# Patient Record
Sex: Female | Born: 1995 | Race: White | Hispanic: No | Marital: Single | Smoking: Former smoker
Health system: Southern US, Community
[De-identification: ages and names within clinical notes are randomized; demographics above are authoritative.]

## PROBLEM LIST (undated history)

## (undated) ENCOUNTER — Inpatient Hospital Stay: Payer: Self-pay

## (undated) DIAGNOSIS — N289 Disorder of kidney and ureter, unspecified: Secondary | ICD-10-CM

## (undated) DIAGNOSIS — G2581 Restless legs syndrome: Secondary | ICD-10-CM

## (undated) DIAGNOSIS — N23 Unspecified renal colic: Secondary | ICD-10-CM

## (undated) DIAGNOSIS — E039 Hypothyroidism, unspecified: Secondary | ICD-10-CM

## (undated) DIAGNOSIS — K011 Impacted teeth: Secondary | ICD-10-CM

## (undated) DIAGNOSIS — F339 Major depressive disorder, recurrent, unspecified: Secondary | ICD-10-CM

## (undated) DIAGNOSIS — T8859XA Other complications of anesthesia, initial encounter: Secondary | ICD-10-CM

## (undated) DIAGNOSIS — R05 Cough: Secondary | ICD-10-CM

## (undated) DIAGNOSIS — T4145XA Adverse effect of unspecified anesthetic, initial encounter: Secondary | ICD-10-CM

## (undated) DIAGNOSIS — J45909 Unspecified asthma, uncomplicated: Secondary | ICD-10-CM

## (undated) DIAGNOSIS — F1910 Other psychoactive substance abuse, uncomplicated: Secondary | ICD-10-CM

## (undated) DIAGNOSIS — F4310 Post-traumatic stress disorder, unspecified: Secondary | ICD-10-CM

## (undated) DIAGNOSIS — R197 Diarrhea, unspecified: Secondary | ICD-10-CM

## (undated) DIAGNOSIS — N200 Calculus of kidney: Secondary | ICD-10-CM

## (undated) HISTORY — DX: Post-traumatic stress disorder, unspecified: F43.10

## (undated) HISTORY — PX: LITHOTRIPSY: SUR834

## (undated) HISTORY — PX: OTHER SURGICAL HISTORY: SHX169

## (undated) HISTORY — DX: Calculus of kidney: N20.0

## (undated) HISTORY — DX: Other psychoactive substance abuse, uncomplicated: F19.10

## (undated) HISTORY — DX: Diarrhea, unspecified: R19.7

## (undated) HISTORY — DX: Cough: R05

## (undated) HISTORY — DX: Unspecified asthma, uncomplicated: J45.909

## (undated) HISTORY — DX: Major depressive disorder, recurrent, unspecified: F33.9

## (undated) HISTORY — PX: DENTAL SURGERY: SHX609

## (undated) HISTORY — DX: Unspecified renal colic: N23

---
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
## 2015-01-09 ENCOUNTER — Emergency Department

## 2015-01-09 ENCOUNTER — Encounter: Payer: Self-pay | Admitting: Emergency Medicine

## 2015-01-09 ENCOUNTER — Emergency Department
Admission: EM | Admit: 2015-01-09 | Discharge: 2015-01-09 | Disposition: A | Discharge status: Home / Self Care | Attending: Emergency Medicine | Admitting: Emergency Medicine

## 2015-01-09 DIAGNOSIS — F17210 Nicotine dependence, cigarettes, uncomplicated: Secondary | ICD-10-CM | POA: Diagnosis not present

## 2015-01-09 DIAGNOSIS — Z3A08 8 weeks gestation of pregnancy: Secondary | ICD-10-CM | POA: Insufficient documentation

## 2015-01-09 DIAGNOSIS — R109 Unspecified abdominal pain: Secondary | ICD-10-CM | POA: Diagnosis not present

## 2015-01-09 DIAGNOSIS — Z79899 Other long term (current) drug therapy: Secondary | ICD-10-CM | POA: Diagnosis not present

## 2015-01-09 DIAGNOSIS — O210 Mild hyperemesis gravidarum: Secondary | ICD-10-CM | POA: Insufficient documentation

## 2015-01-09 DIAGNOSIS — O99331 Smoking (tobacco) complicating pregnancy, first trimester: Secondary | ICD-10-CM | POA: Insufficient documentation

## 2015-01-09 DIAGNOSIS — O9989 Other specified diseases and conditions complicating pregnancy, childbirth and the puerperium: Secondary | ICD-10-CM | POA: Diagnosis present

## 2015-01-09 LAB — CBC
HEMATOCRIT: 42.6 % (ref 35.0–47.0)
Hemoglobin: 14.6 g/dL (ref 12.0–16.0)
MCH: 29.7 pg (ref 26.0–34.0)
MCHC: 34.2 g/dL (ref 32.0–36.0)
MCV: 86.9 fL (ref 80.0–100.0)
Platelets: 183 10*3/uL (ref 150–440)
RBC: 4.9 MIL/uL (ref 3.80–5.20)
RDW: 12.5 % (ref 11.5–14.5)
WBC: 14.6 10*3/uL — ABNORMAL HIGH (ref 3.6–11.0)

## 2015-01-09 LAB — COMPREHENSIVE METABOLIC PANEL
ALBUMIN: 4.1 g/dL (ref 3.5–5.0)
ALT: 11 U/L — ABNORMAL LOW (ref 14–54)
ANION GAP: 6 (ref 5–15)
AST: 15 U/L (ref 15–41)
Alkaline Phosphatase: 55 U/L (ref 38–126)
BILIRUBIN TOTAL: 0.5 mg/dL (ref 0.3–1.2)
BUN: 8 mg/dL (ref 6–20)
CALCIUM: 9.1 mg/dL (ref 8.9–10.3)
CO2: 24 mmol/L (ref 22–32)
Chloride: 108 mmol/L (ref 101–111)
Creatinine, Ser: 0.52 mg/dL (ref 0.44–1.00)
GFR calc non Af Amer: >60 mL/min (ref >60)
Glucose, Bld: 90 mg/dL (ref 65–99)
POTASSIUM: 3.7 mmol/L (ref 3.5–5.1)
Sodium: 138 mmol/L (ref 135–145)
TOTAL PROTEIN: 6.9 g/dL (ref 6.5–8.1)

## 2015-01-09 LAB — ABO/RH: ABO/RH(D): O POS

## 2015-01-09 LAB — URINALYSIS COMPLETE WITH MICROSCOPIC (ARMC ONLY)
BILIRUBIN URINE: NEGATIVE
Bacteria, UA: NONE SEEN
GLUCOSE, UA: NEGATIVE mg/dL
HGB URINE DIPSTICK: NEGATIVE
Ketones, ur: NEGATIVE mg/dL
LEUKOCYTES UA: NEGATIVE
NITRITE: NEGATIVE
Protein, ur: NEGATIVE mg/dL
SPECIFIC GRAVITY, URINE: 1.017 (ref 1.005–1.030)
pH: 8 (ref 5.0–8.0)

## 2015-01-09 LAB — HCG, QUANTITATIVE, PREGNANCY: HCG, BETA CHAIN, QUANT, S: 138082 m[IU]/mL — AB (ref <5)

## 2015-01-09 MED ORDER — PROMETHAZINE HCL 12.5 MG PO TABS: 12.5000 mg | ORAL_TABLET | Freq: Four times a day (QID) | ORAL | Status: DC | PRN

## 2015-01-09 MED ORDER — SODIUM CHLORIDE 0.9 % IV BOLUS (SEPSIS)
1000.0000 mL | Freq: Once | INTRAVENOUS | Status: AC
  Administered 2015-01-09: 1000 mL via INTRAVENOUS

## 2015-01-09 MED ORDER — PROMETHAZINE HCL 25 MG/ML IJ SOLN
12.5000 mg | Freq: Once | INTRAMUSCULAR | Status: AC
  Administered 2015-01-09: 12.5 mg via INTRAVENOUS
  Filled 2015-01-09: qty 1

## 2015-01-09 NOTE — ED Notes (Signed)
NS bolus still infusing.

## 2015-01-09 NOTE — ED Provider Notes (Signed)
[facility] Emergency Department Provider Note  ____________________________________________  Time seen: 6:15 AM  I have reviewed the triage vital signs and the nursing notes.   HISTORY  Chief Complaint Emesis and Abdominal Pain      HPI Kelsey Serrano is a 19 y.o. female presents with history of being approximately [redacted] weeks pregnant. Patient is G2 para 1 presenting with vomiting 3 days with very poor by mouth intake. Patient also admits to an episode of vaginal spotting on Thursday of last week which is since resolved. Patient states that she was prescribed Zofran by her PMD however this is not alleviating her nausea or vomiting.     Past Medical History  Diagnosis Date  . Asthma   . Renal disorder   . 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  No prior ED visits or hospitalization   . Calculus of kidney 10/25/2014  . Chronic cough 01/01/2012    Overview:  A.  "smokers cough"   . D (diarrhea) 10/25/2014  . 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  Depression, PTSA, mood disorder NOS B.  Psychiatrist:  Mathews Argyleachel Dew C.  Therapist:  Elita Quickheryl Lawson   . Depression, major, recurrent (HCC) 10/02/2011  . Abuse, drug or alcohol 11/07/2010    Overview:  A.  Never IVDA.   prescription drugs, THC, methamphetamines.   B.   Denies any since March 2013.  No methamphetamines 04/24/2009 Addendum Sheppard PentonWolf- [facility] check is negative, reviewed with PCP do not think current problem   . Renal colic 10/25/2014  . Complication of anesthesia     BP and HR were low after lithotripsy([facility])  . Restless leg syndrome   . Impacted tooth     current - wisdon tooth  . Hypothyroidism     when younger - resolved    Patient Active Problem List   Diagnosis Date Noted  . Chronic diarrhea of unknown origin   . Nausea with vomiting   . Gastritis   . D (diarrhea) 10/25/2014  . Abdominal pain, epigastric 10/25/2014  . Calculus of kidney 10/25/2014  . Renal colic 10/25/2014  .  Current smoker 04/12/2013  . Chronic cough 01/01/2012  . 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 Depression, major, recurrent (HCC) 10/02/2011  . 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 Abuse, drug or alcohol 11/07/2010    Past Surgical History  Procedure Laterality Date  . Lithotripsy    . Kidney stones Left   . Kidney stent    . Colonoscopy with propofol N/A 12/04/2014    Procedure: COLONOSCOPY WITH PROPOFOL;  Surgeon: Midge Miniumarren Wohl, MD;  [facility];  Service: Endoscopy;  Laterality: N/A;  . Esophagogastroduodenoscopy (egd) with propofol N/A 12/04/2014    Procedure: ESOPHAGOGASTRODUODENOSCOPY (EGD) WITH PROPOFOL;  Surgeon: Midge Miniumarren Wohl, MD;  [facility];  Service: Endoscopy;  Laterality: N/A;    Current Outpatient Rx  Name  Route  Sig  Dispense  Refill  . acetaminophen (TYLENOL) 325 MG tablet   Oral   Take 650 mg by mouth every 6 (six) hours as needed for mild pain, moderate pain, fever or headache.         . dicyclomine (BENTYL) 20 MG tablet   Oral   Take 1 tablet (20 mg total) by mouth 3 (three) times daily as needed for spasms.   30 tablet   0   . esomeprazole (NEXIUM) 20 MG capsule   Oral   Take 1 capsule (20 mg total) by  mouth daily.   30 capsule   1   . fluconazole (DIFLUCAN) 150 MG tablet   Oral   Take 1 tablet (150 mg total) by mouth daily. Patient not taking: Reported on 11/30/2014   1 tablet   0   . HYDROcodone-acetaminophen (NORCO/VICODIN) 5-325 MG per tablet   Oral   Take 1 tablet by mouth every 6 (six) hours as needed for moderate pain. Patient not taking: Reported on 12/06/2014   15 tablet   0   . metoCLOPramide (REGLAN) 10 MG tablet   Oral   Take 1 tablet (10 mg total) by mouth every 6 (six) hours as needed for nausea or vomiting.   12 tablet   1   . Prenatal Vit-Fe Fumarate-FA (PNV PRENATAL PLUS MULTIVITAMIN) 27-1 MG TABS   Oral   Take 1 tablet by mouth daily.      1   . tamsulosin (FLOMAX) 0.4 MG CAPS  capsule   Oral   Take 0.4 mg by mouth daily.           Allergies Estrogens; Lavender oil; and Zyrtec  Family History  Problem Relation Age of Onset  . Diabetes Mother     Social History Social History  Substance Use Topics  . Smoking status: Current Every Day Smoker -- 0.25 packs/day for 7 years    Types: Cigarettes  . Smokeless tobacco: Never Used  . Alcohol Use: Yes     Comment: rare- couple times per year    Review of Systems  Constitutional: Negative for fever. Eyes: Negative for visual changes. ENT: Negative for sore throat. Cardiovascular: Negative for chest pain. Respiratory: Negative for shortness of breath. Gastrointestinal: Negative for abdominal pain. Positive for vomiting Genitourinary: Negative for dysuria. Musculoskeletal: Negative for back pain. Skin: Negative for rash. Neurological: Negative for headaches, focal weakness or numbness.   10-point ROS otherwise negative.  ____________________________________________   PHYSICAL EXAM:  VITAL SIGNS: ED Triage Vitals  Enc Vitals Group     BP 01/09/15 0346 117/52 mmHg     Pulse Rate 01/09/15 0346 72     Resp --      Temp 01/09/15 0346 97.8 F (36.6 C)     Temp Source 01/09/15 0346 Oral     SpO2 01/09/15 0346 100 %     Weight 01/09/15 0346 125 lb (56.7 kg)     Height 01/09/15 0346 5\' 2"  (1.575 m)     Head Cir --      Peak Flow --      Pain Score 01/09/15 0401 7     Pain Loc --      Pain Edu? --      Excl. in GC? --      Constitutional: Alert and oriented. Well appearing and in no distress. Eyes: Conjunctivae are normal. PERRL. Normal extraocular movements. ENT   Head: Normocephalic and atraumatic.   Nose: No congestion/rhinnorhea.   Mouth/Throat: Dry oral and nasal mucosa   Neck: No stridor. Hematological/Lymphatic/Immunilogical: No cervical lymphadenopathy. Cardiovascular: Normal rate, regular rhythm. Normal and symmetric distal pulses are present in all extremities. No  murmurs, rubs, or gallops. Respiratory: Normal respiratory effort without tachypnea nor retractions. Breath sounds are clear and equal bilaterally. No wheezes/rales/rhonchi. Gastrointestinal: Soft and nontender. No distention. There is no CVA tenderness. Genitourinary: deferred Musculoskeletal: Nontender with normal range of motion in all extremities. No joint effusions.  No lower extremity tenderness nor edema. Neurologic:  Normal speech and language. No gross focal neurologic deficits are appreciated. Speech  is normal.  Skin:  Skin is warm, dry and intact. No rash noted. Psychiatric: Mood and affect are normal. Speech and behavior are normal. Patient exhibits appropriate insight and judgment.  ____________________________________________    LABS (pertinent positives/negatives)  Labs Reviewed  CBC - Abnormal; Notable for the following:    WBC 14.6 (*)    All other components within normal limits  COMPREHENSIVE METABOLIC PANEL - Abnormal; Notable for the following:    ALT 11 (*)    All other components within normal limits  HCG, QUANTITATIVE, PREGNANCY - Abnormal; Notable for the following:    hCG, Beta Francene Finders [id number] (*)    All other components within normal limits  URINALYSIS COMPLETEWITH MICROSCOPIC ([facility] ONLY) - Abnormal; Notable for the following:    Color, Urine YELLOW (*)    APPearance CLEAR (*)    Squamous Epithelial / LPF 0-5 (*)    All other components within normal limits  ABO/RH        RADIOLOGY  OB Comp Less 14 Wks (Final result) Result time: 01/09/15 05:42:51   Final result by Rad Results In Interface (01/09/15 05:42:51)   Narrative:   CLINICAL DATA: Abdominal pain in first trimester pregnancy  EXAM: OBSTETRIC <14 WK [place] AND TRANSVAGINAL OB US  TECHNIQUE: Both transabdominal and transvaginal ultrasound examinations were performed for complete evaluation of the gestation as well as the maternal uterus, adnexal regions, and pelvic  cul-de-sac. Transvaginal technique was performed to assess early pregnancy.  COMPARISON: Abdominal CT 12/06/2014  FINDINGS: Intrauterine gestational sac: Visualized/normal in shape. No subchorionic hematoma  Yolk sac: Present  Embryo: Present  Cardiac Activity: Present  Heart Rate: 160 bpm  CRL: 14.6 mm  7 w  6 d         [place] EDC: 08/22/2015  Maternal uterus/adnexae: Physiologic appearance of the ovaries. No free pelvic fluid.  IMPRESSION: Single intrauterine gestation measuring 7 weeks 6 days. No adverse findings.   Electronically Signed By: Marnee Spring M.D. On: 01/09/2015 05:42          US Ob Transvaginal (Final result) Result time: 01/09/15 05:42:51   Final result by Rad Results In Interface (01/09/15 05:42:51)   Narrative:   CLINICAL DATA: Abdominal pain in first trimester pregnancy  EXAM: OBSTETRIC <14 WK [place] AND TRANSVAGINAL OB US  TECHNIQUE: Both transabdominal and transvaginal ultrasound examinations were performed for complete evaluation of the gestation as well as the maternal uterus, adnexal regions, and pelvic cul-de-sac. Transvaginal technique was performed to assess early pregnancy.  COMPARISON: Abdominal CT 12/06/2014  FINDINGS: Intrauterine gestational sac: Visualized/normal in shape. No subchorionic hematoma  Yolk sac: Present  Embryo: Present  Cardiac Activity: Present  Heart Rate: 160 bpm  CRL: 14.6 mm  7 w  6 d         [place] EDC: 08/22/2015  Maternal uterus/adnexae: Physiologic appearance of the ovaries. No free pelvic fluid.  IMPRESSION: Single intrauterine gestation measuring 7 weeks 6 days. No adverse findings.   Electronically Signed By: Marnee Spring M.D. On: 01/09/2015 05:42      INITIAL IMPRESSION / ASSESSMENT AND PLAN / ED COURSE  Pertinent labs & imaging results that were available during my care of the patient were reviewed by me and considered in my medical  decision making (see chart for details).  History of physical exam consistent with hyperemesis gravidarum. Patient received 12.5 mg of IV Phenergan as well as normal saline 2 L IV.  ____________________________________________   FINAL CLINICAL IMPRESSION(S) / ED DIAGNOSES  Final  diagnoses:  None      Darci Current, MD 01/09/15 [phone]

## 2015-01-09 NOTE — Discharge Instructions (Signed)
Hyperemesis Gravidarum  Hyperemesis gravidarum is a severe form of nausea and vomiting that happens during pregnancy. Hyperemesis is worse than morning sickness. It may cause you to have nausea or vomiting all day for many days. It may keep you from eating and drinking enough food and liquids. Hyperemesis usually occurs during the first half (the first 20 weeks) of pregnancy. It often goes away once a woman is in her second half of pregnancy. However, sometimes hyperemesis continues through an entire pregnancy.   CAUSES   The cause of this condition is not completely known but is thought to be related to changes in the body's hormones when pregnant. It could be from the high level of the pregnancy hormone or an increase in estrogen in the body.   SIGNS AND SYMPTOMS    Severe nausea and vomiting.   Nausea that does not go away.   Vomiting that does not allow you to keep any food down.   Weight loss and body fluid loss (dehydration).   Having no desire to eat or not liking food you have previously enjoyed.  DIAGNOSIS   Your health care provider will do a physical exam and ask you about your symptoms. He or she may also order blood tests and urine tests to make sure something else is not causing the problem.   TREATMENT   You may only need medicine to control the problem. If medicines do not control the nausea and vomiting, you will be treated in the hospital to prevent dehydration, increased acid in the blood (acidosis), weight loss, and changes in the electrolytes in your body that may harm the unborn baby (fetus). You may need IV fluids.   HOME CARE INSTRUCTIONS    Only take over-the-counter or prescription medicines as directed by your health care provider.   Try eating a couple of dry crackers or toast in the morning before getting out of bed.   Avoid foods and smells that upset your stomach.   Avoid fatty and spicy foods.   Eat 5-6 small meals a day.   Do not drink when eating meals. Drink between  meals.   For snacks, eat high-protein foods, such as cheese.   Eat or suck on things that have ginger in them. Ginger helps nausea.   Avoid food preparation. The smell of food can spoil your appetite.   Avoid iron pills and iron in your multivitamins until after 3-4 months of being pregnant. However, consult with your health care provider before stopping any prescribed iron pills.  SEEK MEDICAL CARE IF:    Your abdominal pain increases.   You have a severe headache.   You have vision problems.   You are losing weight.  SEEK IMMEDIATE MEDICAL CARE IF:    You are unable to keep fluids down.   You vomit blood.   You have constant nausea and vomiting.   You have excessive weakness.   You have extreme thirst.   You have dizziness or fainting.   You have a fever or persistent symptoms for more than 2-3 days.   You have a fever and your symptoms suddenly get worse.  MAKE SURE YOU:    Understand these instructions.   Will watch your condition.   Will get help right away if you are not doing well or get worse.     This information is not intended to replace advice given to you by your health care provider. Make sure you discuss any questions you have with   your health care provider.     Document Released: 02/17/2005 Document Revised: 12/08/2012 Document Reviewed: 09/29/2012  Elsevier Interactive Patient Education 2016 [employer]
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
## 2015-01-09 NOTE — ED Notes (Addendum)
Patient ambulatory to triage with steady gait, without difficulty or distress noted; pt reports vomiting with abd pain since Friday; st taking zofran as rx by PCP by not helping; pt st [redacted]wks pregnant; G2; at end of triage, pt reports this am, fell in shower x 2

## 2015-01-09 NOTE — ED Notes (Signed)
[employer] to bedside.

## 2015-06-30 ENCOUNTER — Encounter: Payer: Self-pay | Admitting: *Deleted

## 2015-06-30 ENCOUNTER — Observation Stay
Admission: EM | Admit: 2015-06-30 | Discharge: 2015-06-30 | Disposition: A | Discharge status: Home / Self Care | Attending: Obstetrics and Gynecology | Admitting: Obstetrics and Gynecology

## 2015-06-30 DIAGNOSIS — O2343 Unspecified infection of urinary tract in pregnancy, third trimester: Principal | ICD-10-CM | POA: Insufficient documentation

## 2015-06-30 DIAGNOSIS — O9989 Other specified diseases and conditions complicating pregnancy, childbirth and the puerperium: Secondary | ICD-10-CM | POA: Insufficient documentation

## 2015-06-30 DIAGNOSIS — B9620 Unspecified Escherichia coli [E. coli] as the cause of diseases classified elsewhere: Secondary | ICD-10-CM | POA: Diagnosis not present

## 2015-06-30 DIAGNOSIS — R112 Nausea with vomiting, unspecified: Secondary | ICD-10-CM | POA: Diagnosis not present

## 2015-06-30 DIAGNOSIS — T887XXA Unspecified adverse effect of drug or medicament, initial encounter: Secondary | ICD-10-CM | POA: Diagnosis present

## 2015-06-30 DIAGNOSIS — Z3A32 32 weeks gestation of pregnancy: Secondary | ICD-10-CM | POA: Insufficient documentation

## 2015-06-30 MED ORDER — PROMETHAZINE HCL 25 MG PO TABS: 25.0000 mg | ORAL_TABLET | Freq: Four times a day (QID) | ORAL | Status: DC | PRN

## 2015-06-30 MED ORDER — CEFTRIAXONE SODIUM 1 G IJ SOLR
INTRAMUSCULAR | Status: AC
  Administered 2015-06-30: 1 g via INTRAMUSCULAR
  Filled 2015-06-30: qty 10

## 2015-06-30 MED ORDER — CEFTRIAXONE SODIUM 1 G IJ SOLR
1.0000 g | INTRAMUSCULAR | Status: DC
  Administered 2015-06-30: 1 g via INTRAMUSCULAR

## 2015-06-30 MED ORDER — LIDOCAINE HCL (PF) 1 % IJ SOLN
INTRAMUSCULAR | Status: AC
  Administered 2015-06-30: 2 mL
  Filled 2015-06-30: qty 2

## 2015-06-30 MED ORDER — AMOXICILLIN 500 MG PO CAPS: 500.0000 mg | ORAL_CAPSULE | Freq: Two times a day (BID) | ORAL | Status: DC

## 2015-06-30 NOTE — OB Triage Note (Signed)
Prescribed Augmentin for bladder/kidney issues (stones) inability to urinate. Augmentin started yesterday. 2 doses taken. N/V started after first dose approx. 0900/1000 yesterday am. Unable to hold down food or liquids. Pt seen @ [facility] for prenatal care. Pt. Lives in [place]  [place], Kelsey Serrano

## 2015-06-30 NOTE — Final Progress Note (Signed)
L&D OB Triage Note  Sima MatasKyrsten E Buchan is a 20 y.o. [id number] female at 2258w6d, EDD Estimated Date of Delivery: 08/19/15 who presented to triage for complaints of nausea and vomiting.  Patient believes it is due to the recent antibiotic she was prescribed for her UTI (currently on Augmentin, [facility]  Patient has a h/o bladder/kidney issues (stones), with impaired ability to urinate at times. She was evaluated by the nurses with no other significant findings.  No further episodes of vomiting while in triage. Vital signs stable. An NST was performed and has been reviewed by MD. She was treated with 1 dose of IV Ceftriaxone.   NST INTERPRETATION: Indications: rule out uterine contractions  Mode: External Baseline Rate (A): 125 bpm Variability: Moderate Accelerations: 15 x 15 Decelerations: None     Contraction Frequency (min): no ctx noted  Impression: reactive   Plan: NST performed was reviewed and was found to be reactive. She was discharged home with new prescription for Amoxicillin (noted sensitivity on recent U Cx with E. Coli bacteria).  Also given prescription for Phenergan, can take 15-30 min prior to antibiotic dose. [facility]. Follow up with OB/GYN as previously scheduled.     Hildred LaserAnika Christopher Hink, MD [facility]

## 2015-07-05 IMAGING — US US RENAL
1 series · 14 of 25 positions shown · non-contrast
Comparison: CT scan dated 07/06/2014

CLINICAL DATA: Left flank pain.

EXAM:
RENAL / URINARY TRACT ULTRASOUND COMPLETE

[Series 1: us renal · 0.20mm/px · 14 of 61 slices shown]
[im 1/61]
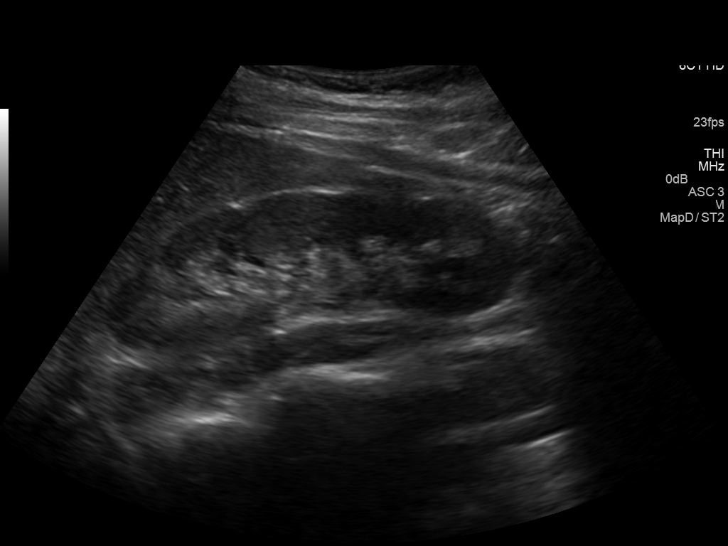
[im 6/61]
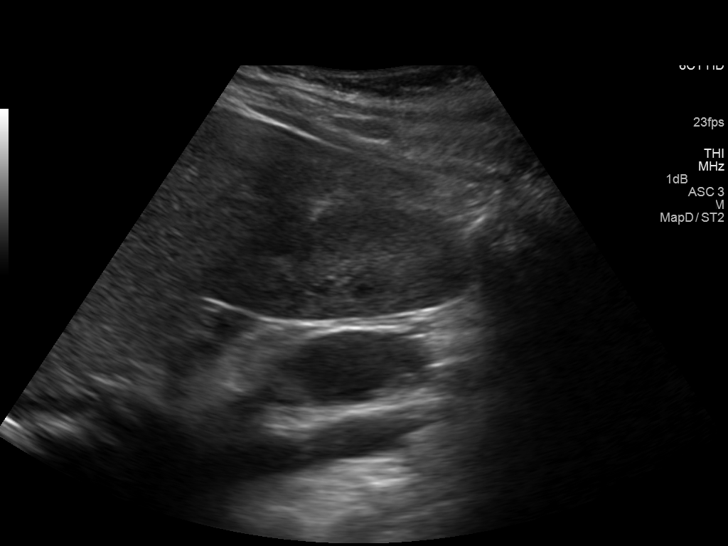
[im 11/61]
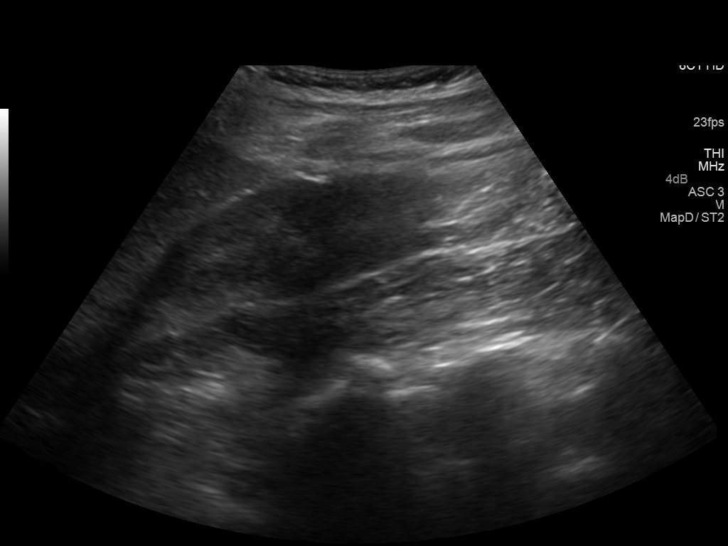
[im 16/61]
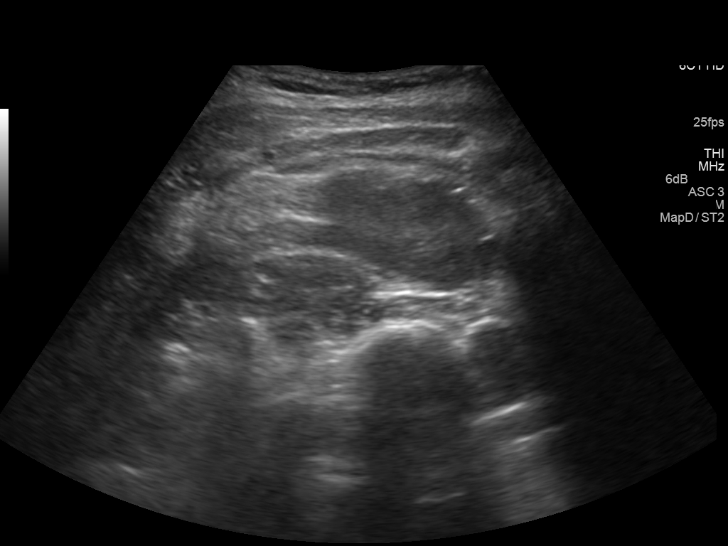
[im 21/61]
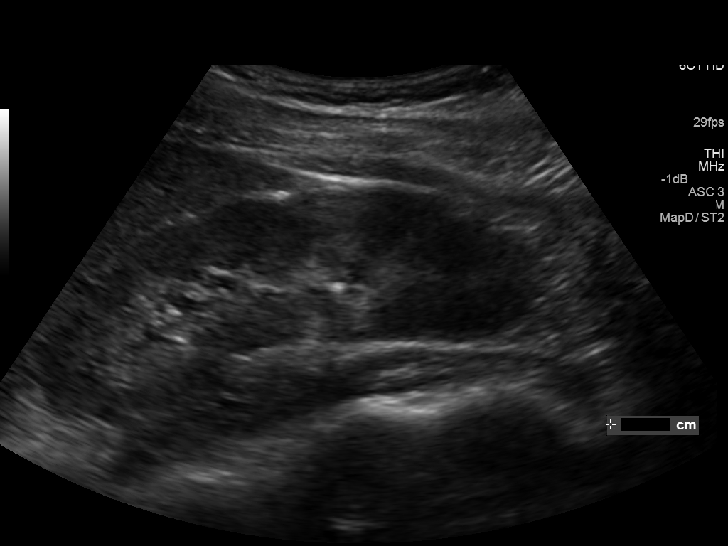
[im 23/61]
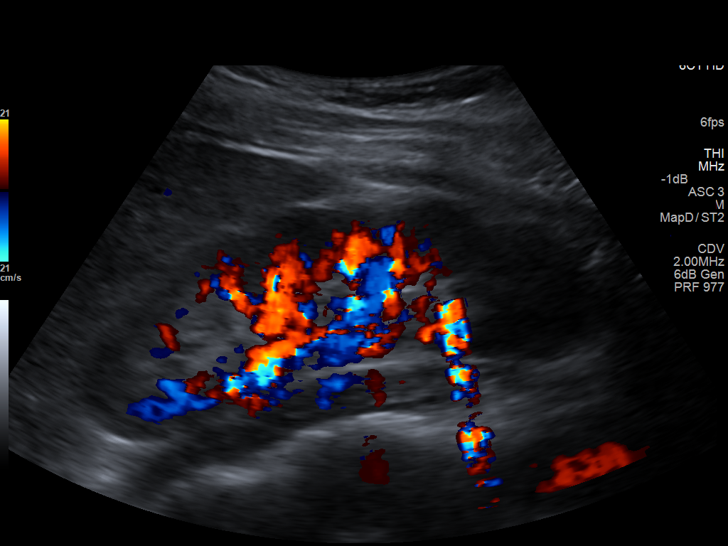
[im 28/61]
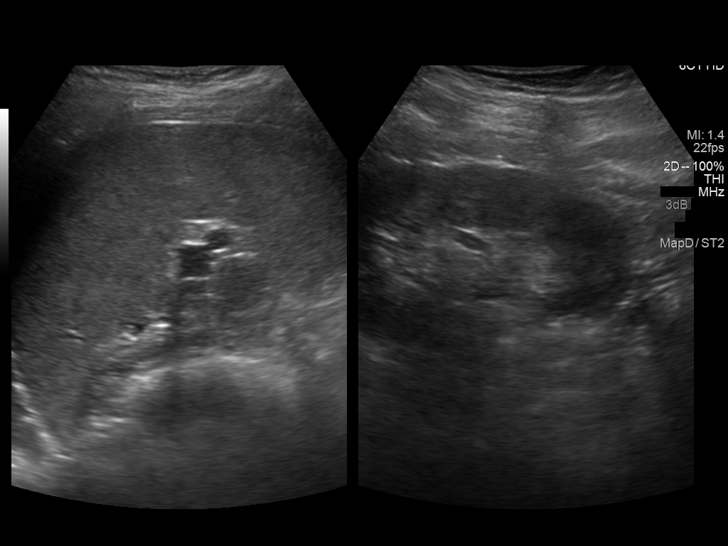
[im 33/61]
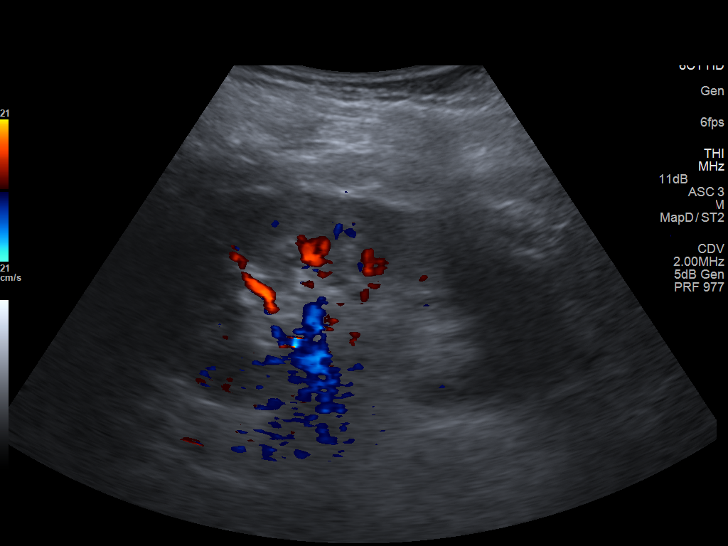
[im 38/61]
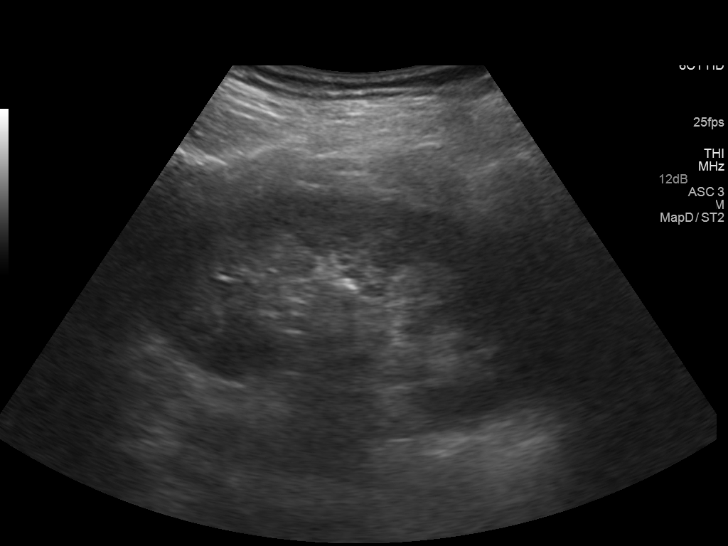
[im 41/61]
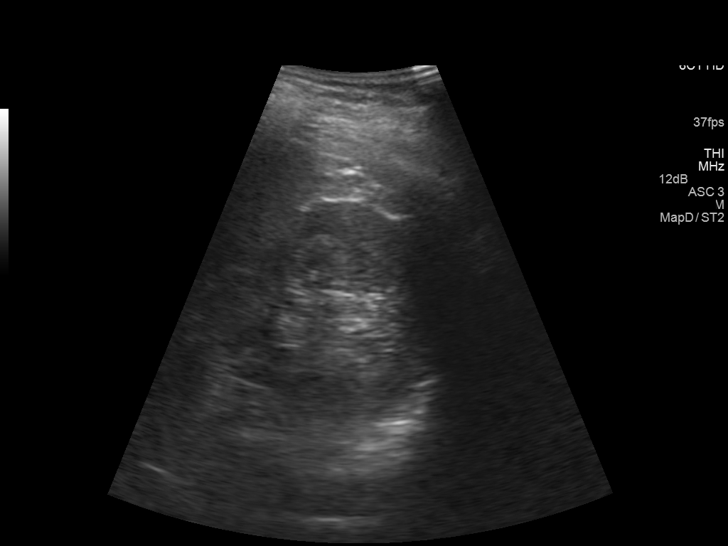
[im 46/61]
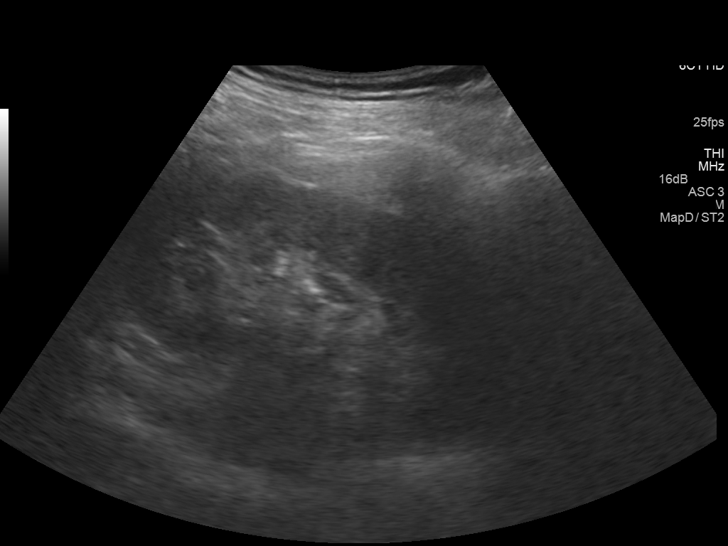
[im 51/61]
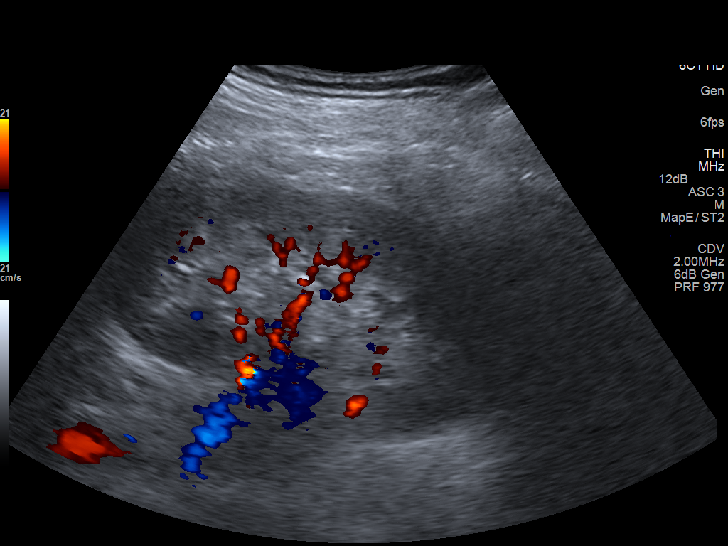
[im 56/61]
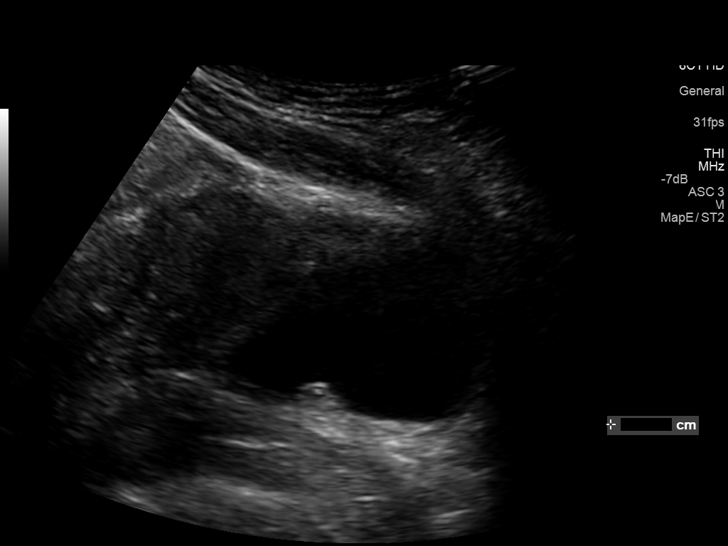
[im 61/61]
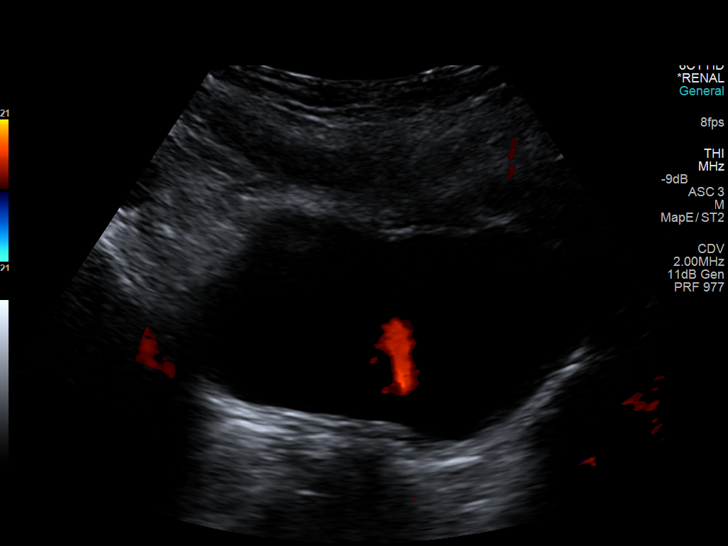

[14 of 25 positions shown; findings below may reference images not displayed]

FINDINGS: Right Kidney:

Length: 11.3 cm. Several tiny stones in the kidney. Echogenicity
within normal limits. No mass or hydronephrosis visualized.

Left Kidney:

Length: 11.2 cm. Tiny stone in the mid left kidney. Echogenicity
within normal limits. No mass or hydronephrosis visualized.

Bladder:

Bilateral ureteral jets identified.
IMPRESSION: Tiny stones in each kidney. No acute abnormality. No hydronephrosis.

## 2015-07-31 IMAGING — US US RENAL
1 series · 14 of 25 positions shown · non-contrast
Comparison: Renal ultrasound and CT 08/05/2014.

CLINICAL DATA: Microscopic hematuria with left flank pain for 2
months. Right-sided lithotripsy 2 months ago per

EXAM:
RENAL / URINARY TRACT ULTRASOUND COMPLETE

[Series 1: us renal · 0.23mm/px · 14 of 34 slices shown]
[im 1/34]
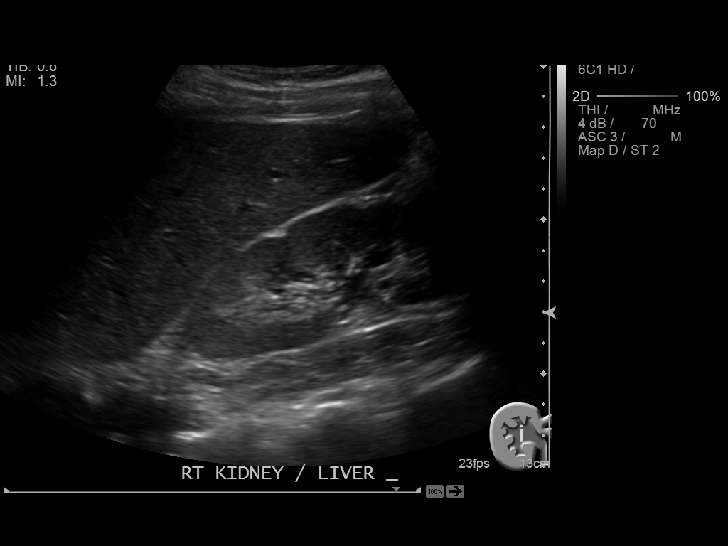
[im 3/34]
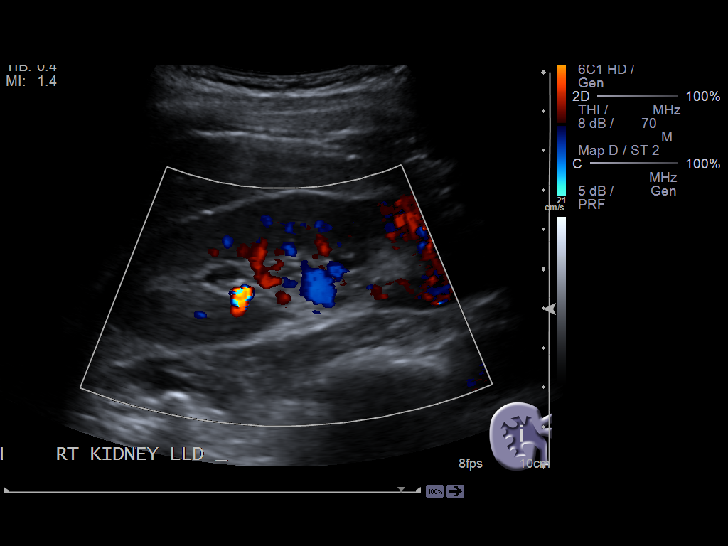
[im 6/34]
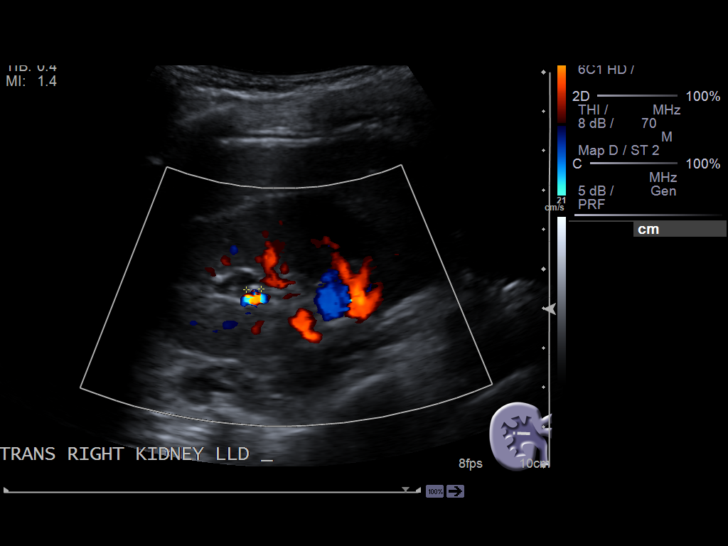
[im 9/34]
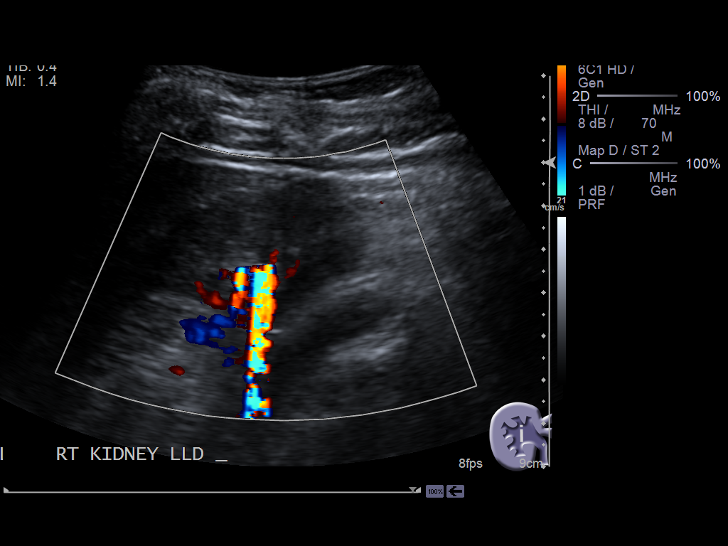
[im 12/34]
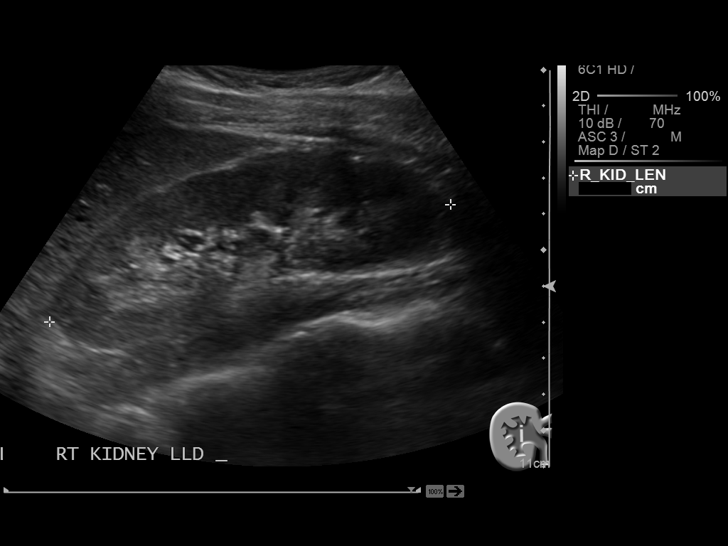
[im 13/34]
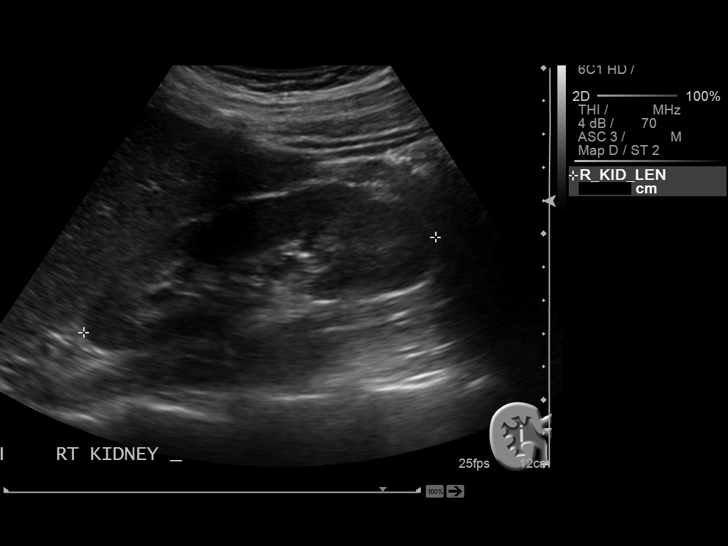
[im 16/34]
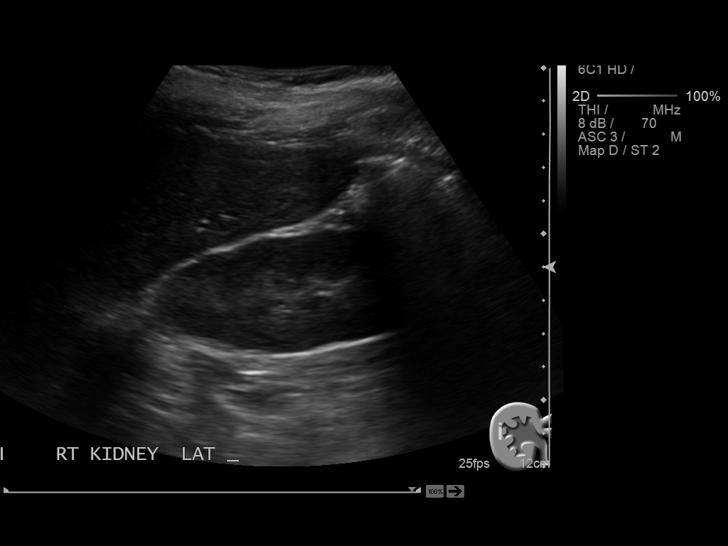
[im 18/34]
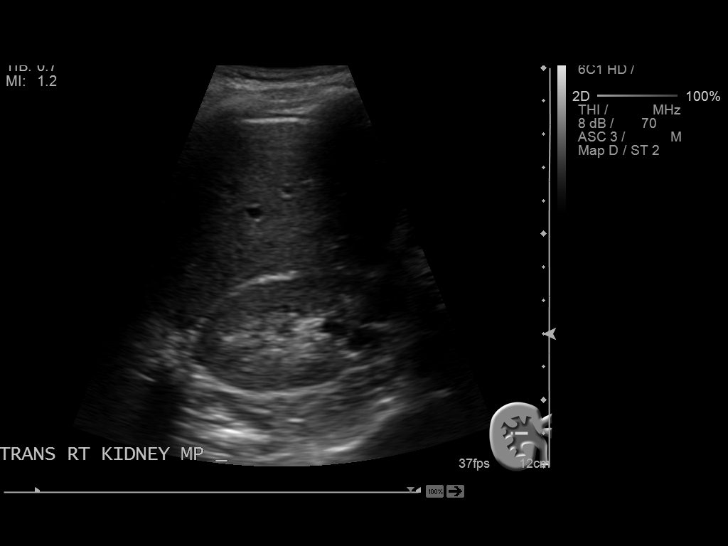
[im 21/34]
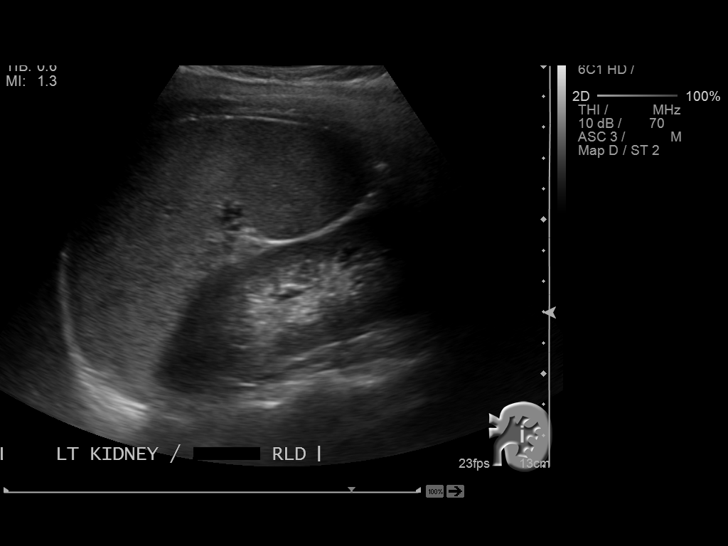
[im 23/34]
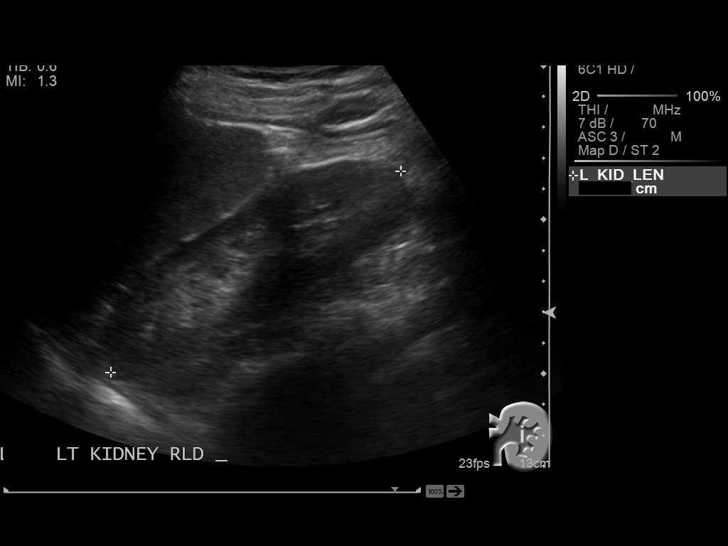
[im 25/34]
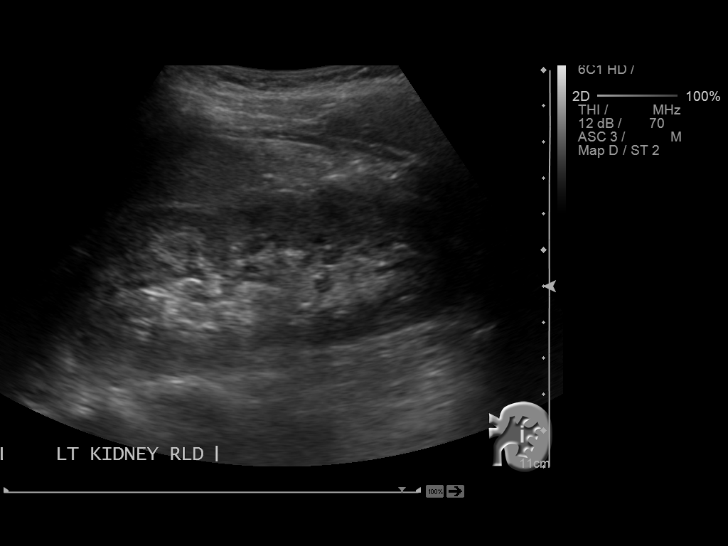
[im 28/34]
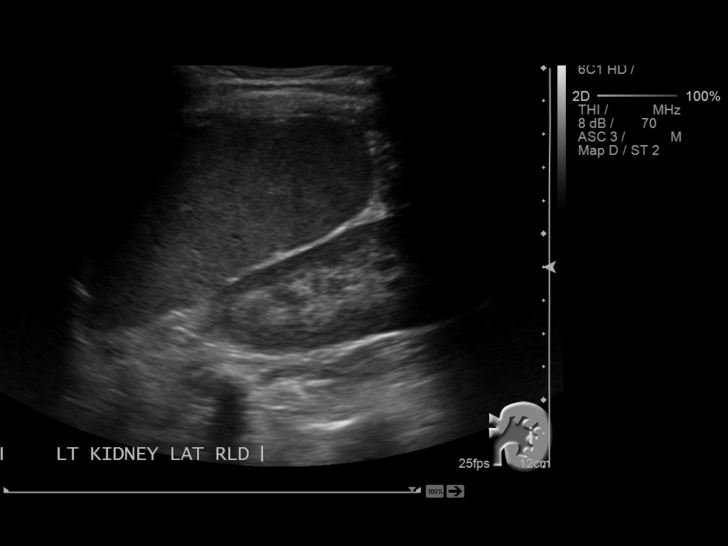
[im 31/34]
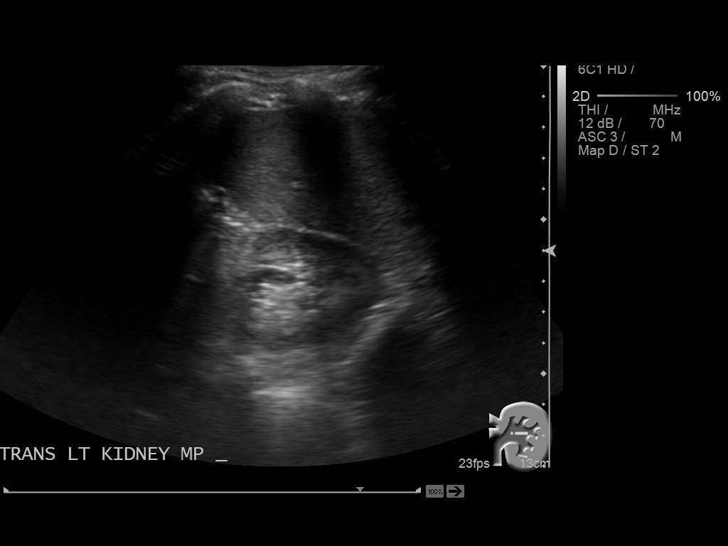
[im 34/34]
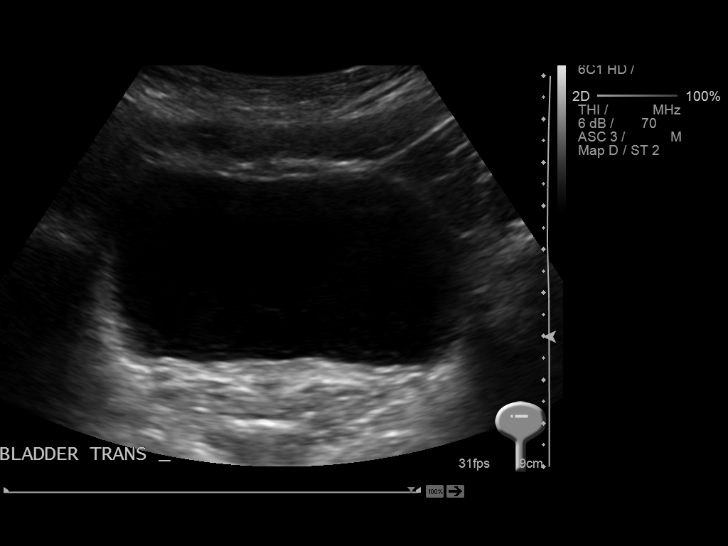

[14 of 25 positions shown; findings below may reference images not displayed]

FINDINGS: Right Kidney:

Length: 11.6 cm. There are probable small caliceal calculi, but no
hydronephrosis or focal cortical lesion.

Left Kidney:

Length: 11.5 cm. Echogenicity within normal limits. No mass or
hydronephrosis visualized. No definite left renal calculus seen by
ultrasound.

Bladder:

Possible dependent debris within the urinary bladder. Assessment for
ureteral jets not performed.
IMPRESSION: 1. No evidence hydronephrosis.
2. Small caliceal calculi are demonstrated on the right. Patient has
known bilateral renal calculi based on prior studies.
3. Possible dependent debris in the bladder.

## 2015-10-29 IMAGING — CT CT ABD-PELV W/ CM
1 of 3 series · 12 of 32 positions shown, 18 images · IV contrast (omnipaque)
Comparison: CT of the abdomen and pelvis from 07/06/2014, and renal
ultrasound performed earlier today at [DATE] p.m.

CLINICAL DATA: Acute onset of left flank pain for 2 days, with
nausea and vomiting. Blood in stool. Recent right-sided lithotripsy
and ureteral stent placement, status post removal. Initial
encounter.

EXAM:
CT ABDOMEN AND PELVIS WITH CONTRAST
TECHNIQUE: Multidetector CT imaging of the abdomen and pelvis was performed
using the standard protocol following bolus administration of
intravenous contrast.
CONTRAST:  100mL OMNIPAQUE IOHEXOL 300 MG/ML  SOLN

[Series 2: routine abd pel with · axial · 0.66mm/px · z∈[-486,-111]mm · 12 of 89 slices shown, 18 images]
[im 7/89  soft-tissue]
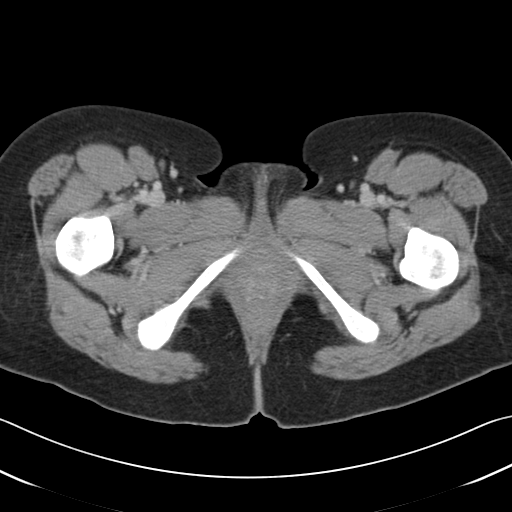
[im 7/89  bone]
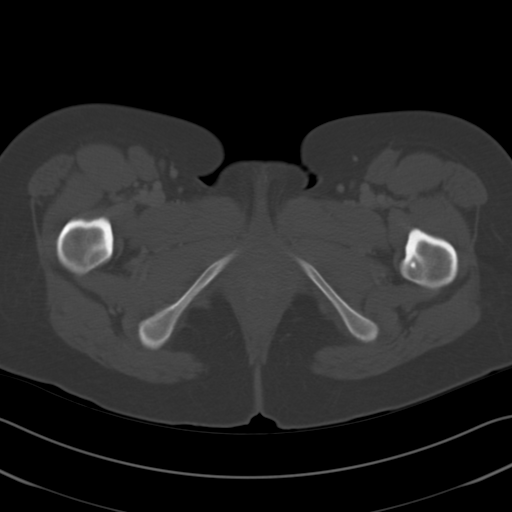
[im 13/89  soft-tissue]
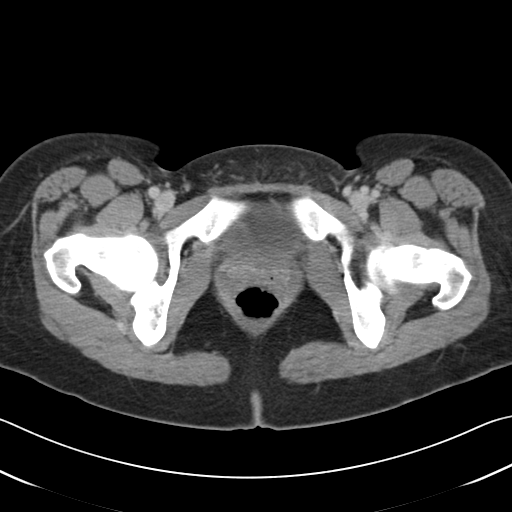
[im 19/89  soft-tissue]
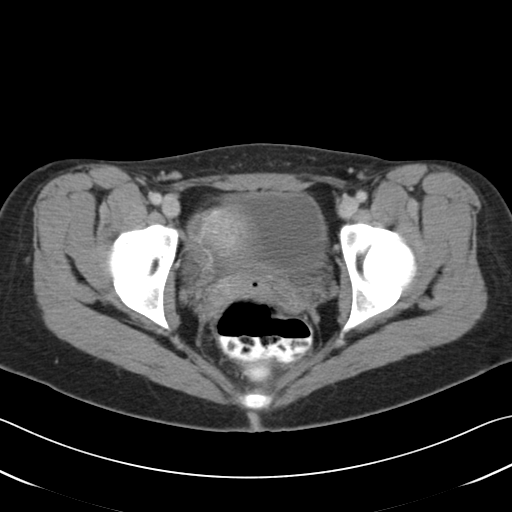
[im 26/89  soft-tissue]
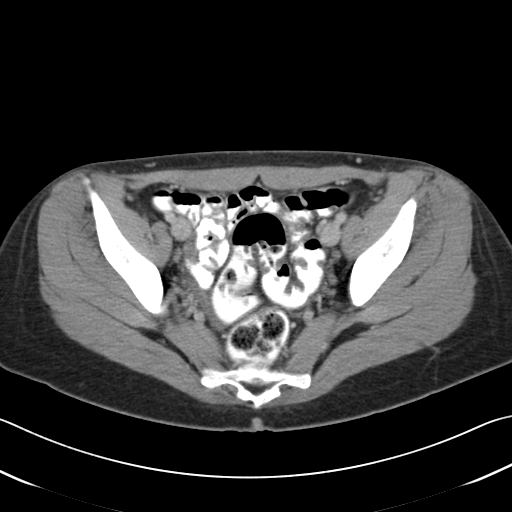
[im 32/89  soft-tissue]
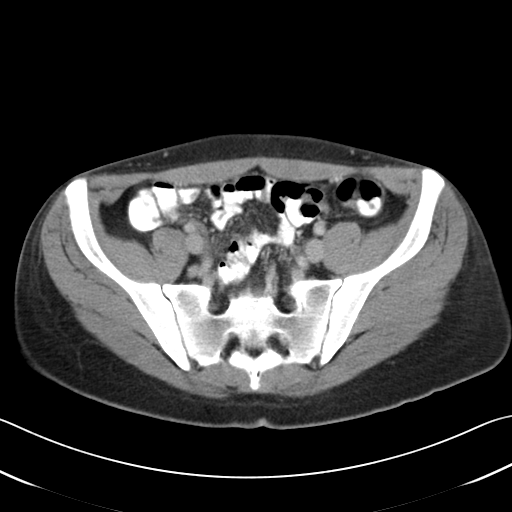
[im 38/89  soft-tissue]
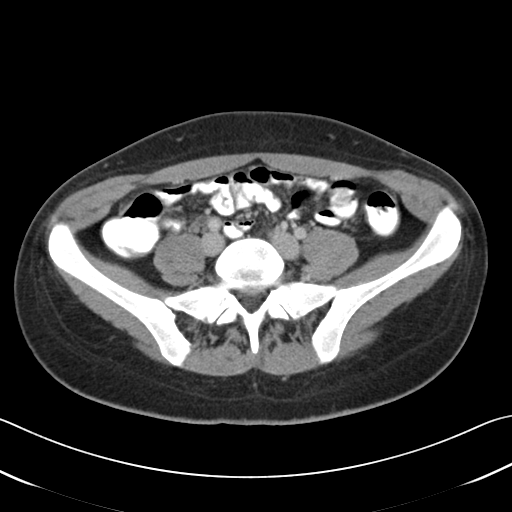
[im 51/89  soft-tissue]
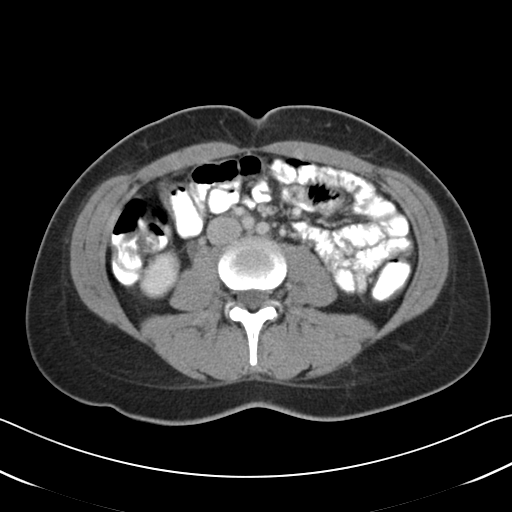
[im 57/89  soft-tissue]
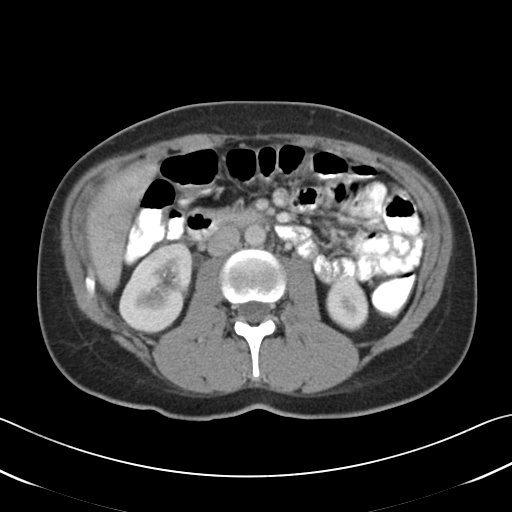
[im 63/89  soft-tissue]
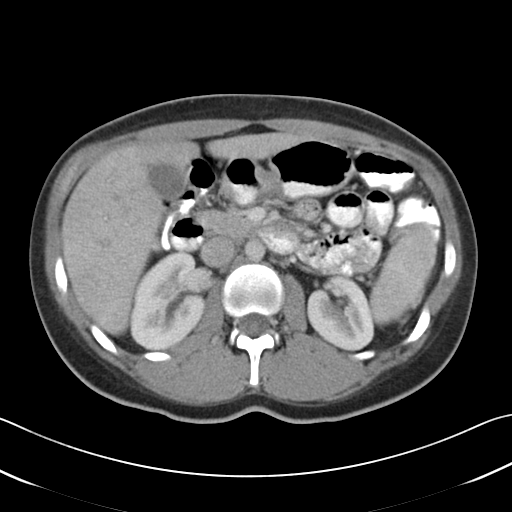
[im 63/89  lung]
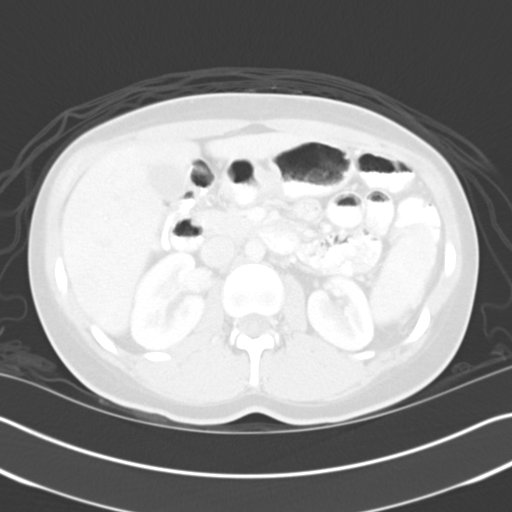
[im 63/89  bone]
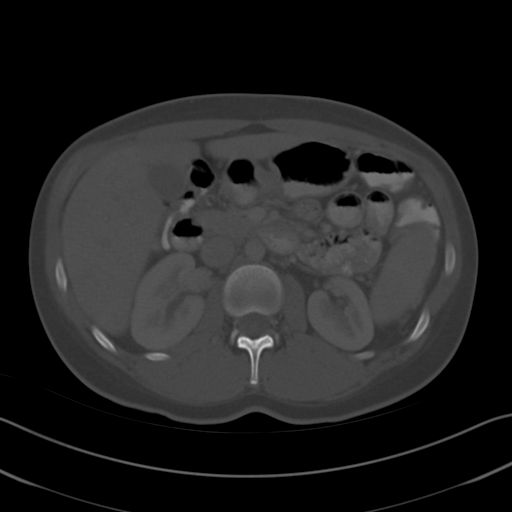
[im 70/89  soft-tissue]
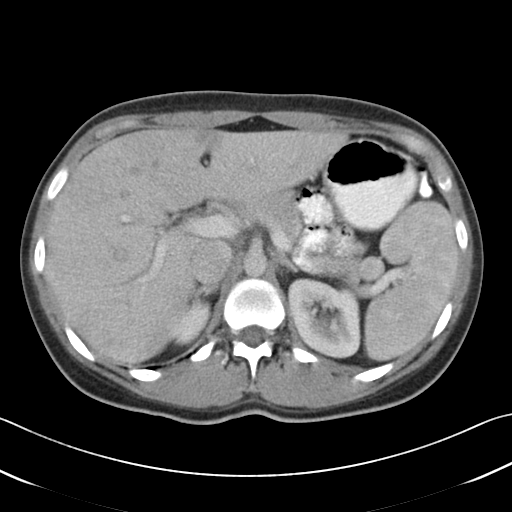
[im 70/89  lung]
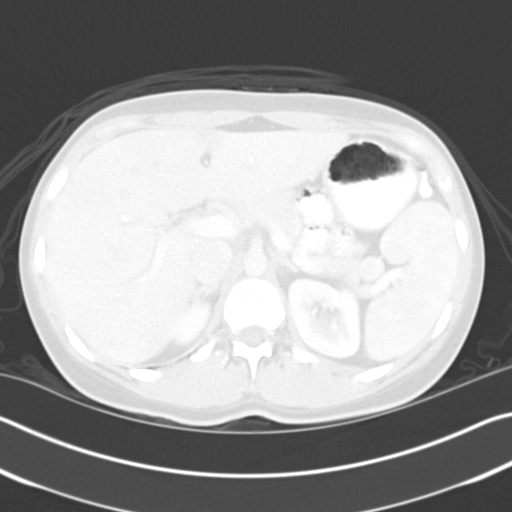
[im 76/89  soft-tissue]
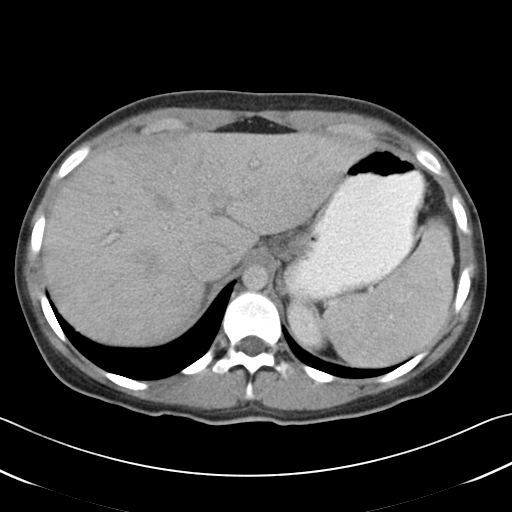
[im 76/89  lung]
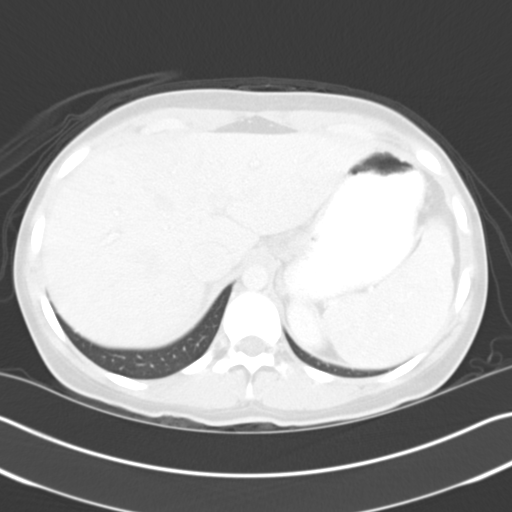
[im 82/89  soft-tissue]
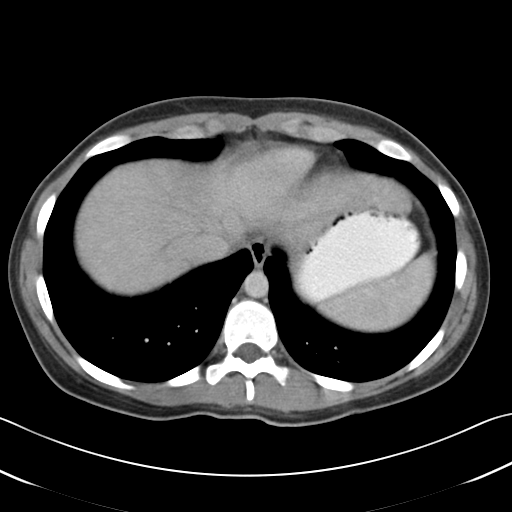
[im 82/89  lung]
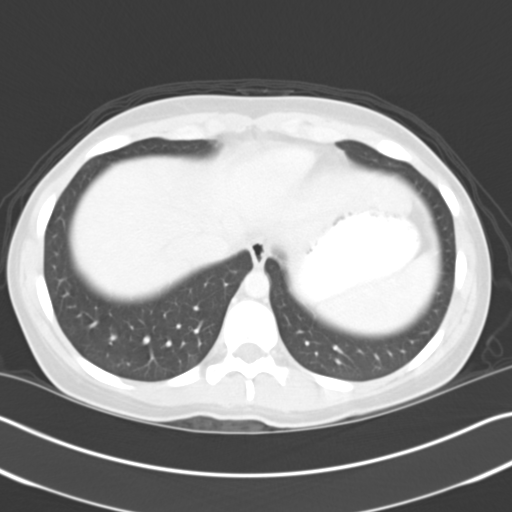

[12 of 32 positions shown; findings below may reference images not displayed]

FINDINGS: The visualized lung bases are clear.

The liver and spleen are unremarkable in appearance. Mild periportal
edema may reflect volume repletion. The gallbladder is within normal
limits. The pancreas and adrenal glands are unremarkable.

There is mild prominence of the right ureter, with mildly increased
wall enhancement. This may simply reflect recent ureteral stent
removal. Alternatively, this could reflect mild ureteritis, though
the patient's symptoms are on the left side. No significant
hydronephrosis is seen.

A few tiny bilateral renal stones are seen, measuring up to 3 mm in
size. No obstructing ureteral stones are identified. No perinephric
stranding is appreciated.

No free fluid is identified. The small bowel is unremarkable in
appearance. The stomach is within normal limits. No acute vascular
abnormalities are seen.

The appendix is normal in caliber, without evidence of appendicitis.
The colon is unremarkable in appearance. Contrast progresses to the
level of the rectum.

The bladder is mildly distended and grossly unremarkable. The uterus
is unremarkable in appearance. The ovaries are relatively symmetric.
No suspicious adnexal masses are seen. No inguinal lymphadenopathy
is seen.

No acute osseous abnormalities are identified.
IMPRESSION: 1. Mild prominence of the right ureter, with mildly increased right
ureteral wall enhancement. This may simply reflect recent ureteral
stent removal. Alternatively, this could reflect mild ureteritis,
though the patient's symptoms are on the left side. No significant
hydronephrosis seen.
2. Few tiny nonobstructing bilateral renal stones measure up to 3 mm
in size.

## 2016-01-18 ENCOUNTER — Emergency Department

## 2016-01-18 ENCOUNTER — Encounter: Payer: Self-pay | Admitting: Emergency Medicine

## 2016-01-18 ENCOUNTER — Emergency Department
Admission: EM | Admit: 2016-01-18 | Discharge: 2016-01-18 | Disposition: A | Discharge status: Home / Self Care | Attending: Emergency Medicine | Admitting: Emergency Medicine

## 2016-01-18 DIAGNOSIS — E039 Hypothyroidism, unspecified: Secondary | ICD-10-CM | POA: Diagnosis not present

## 2016-01-18 DIAGNOSIS — F17210 Nicotine dependence, cigarettes, uncomplicated: Secondary | ICD-10-CM | POA: Insufficient documentation

## 2016-01-18 DIAGNOSIS — J45909 Unspecified asthma, uncomplicated: Secondary | ICD-10-CM | POA: Insufficient documentation

## 2016-01-18 DIAGNOSIS — N939 Abnormal uterine and vaginal bleeding, unspecified: Secondary | ICD-10-CM | POA: Diagnosis present

## 2016-01-18 DIAGNOSIS — N946 Dysmenorrhea, unspecified: Secondary | ICD-10-CM | POA: Insufficient documentation

## 2016-01-18 DIAGNOSIS — Z791 Long term (current) use of non-steroidal anti-inflammatories (NSAID): Secondary | ICD-10-CM | POA: Diagnosis not present

## 2016-01-18 DIAGNOSIS — R102 Pelvic and perineal pain: Secondary | ICD-10-CM

## 2016-01-18 DIAGNOSIS — Z79899 Other long term (current) drug therapy: Secondary | ICD-10-CM | POA: Insufficient documentation

## 2016-01-18 LAB — WET PREP, GENITAL
CLUE CELLS WET PREP: NONE SEEN
Sperm: NONE SEEN
Trich, Wet Prep: NONE SEEN
Yeast Wet Prep HPF POC: NONE SEEN

## 2016-01-18 LAB — CBC WITH DIFFERENTIAL/PLATELET
BASOS PCT: 0 %
Basophils Absolute: 0 10*3/uL (ref 0–0.1)
EOS ABS: 0.1 10*3/uL (ref 0–0.7)
EOS PCT: 1 %
HCT: 43.4 % (ref 35.0–47.0)
HEMOGLOBIN: 15.3 g/dL (ref 12.0–16.0)
LYMPHS ABS: 1.9 10*3/uL (ref 1.0–3.6)
Lymphocytes Relative: 27 %
MCH: 30.3 pg (ref 26.0–34.0)
MCHC: 35.3 g/dL (ref 32.0–36.0)
MCV: 85.8 fL (ref 80.0–100.0)
MONO ABS: 0.5 10*3/uL (ref 0.2–0.9)
MONOS PCT: 8 %
Neutro Abs: 4.6 10*3/uL (ref 1.4–6.5)
Neutrophils Relative %: 64 %
Platelets: 196 10*3/uL (ref 150–440)
RBC: 5.06 MIL/uL (ref 3.80–5.20)
RDW: 12.3 % (ref 11.5–14.5)
WBC: 7.1 10*3/uL (ref 3.6–11.0)

## 2016-01-18 LAB — CHLAMYDIA/NGC RT PCR (ARMC ONLY)
Chlamydia Tr: NOT DETECTED
N gonorrhoeae: NOT DETECTED

## 2016-01-18 LAB — COMPREHENSIVE METABOLIC PANEL
ALK PHOS: 54 U/L (ref 38–126)
ALT: 11 U/L — ABNORMAL LOW (ref 14–54)
ANION GAP: 6 (ref 5–15)
AST: 17 U/L (ref 15–41)
Albumin: 4.6 g/dL (ref 3.5–5.0)
BUN: 12 mg/dL (ref 6–20)
CALCIUM: 8.8 mg/dL — AB (ref 8.9–10.3)
CO2: 22 mmol/L (ref 22–32)
Chloride: 109 mmol/L (ref 101–111)
Creatinine, Ser: 0.7 mg/dL (ref 0.44–1.00)
GFR calc non Af Amer: >60 mL/min (ref >60)
Glucose, Bld: 97 mg/dL (ref 65–99)
POTASSIUM: 3.6 mmol/L (ref 3.5–5.1)
SODIUM: 137 mmol/L (ref 135–145)
TOTAL PROTEIN: 7.6 g/dL (ref 6.5–8.1)
Total Bilirubin: 0.9 mg/dL (ref 0.3–1.2)

## 2016-01-18 LAB — POCT PREGNANCY, URINE: Preg Test, Ur: NEGATIVE

## 2016-01-18 MED ORDER — HYDROMORPHONE HCL 1 MG/ML IJ SOLN
INTRAMUSCULAR | Status: AC
  Administered 2016-01-18: 1 mg via INTRAVENOUS
  Filled 2016-01-18: qty 1

## 2016-01-18 MED ORDER — OXYCODONE-ACETAMINOPHEN 5-325 MG PO TABS
2.0000 | ORAL_TABLET | Freq: Once | ORAL | Status: AC
  Administered 2016-01-18: 2 via ORAL
  Filled 2016-01-18: qty 2

## 2016-01-18 MED ORDER — KETOROLAC TROMETHAMINE 30 MG/ML IJ SOLN
30.0000 mg | Freq: Once | INTRAMUSCULAR | Status: AC
  Administered 2016-01-18: 30 mg via INTRAVENOUS

## 2016-01-18 MED ORDER — IBUPROFEN 800 MG PO TABS: 800.0000 mg | ORAL_TABLET | Freq: Three times a day (TID) | ORAL | 0 refills | Status: DC | PRN

## 2016-01-18 MED ORDER — DIAZEPAM 5 MG PO TABS
ORAL_TABLET | ORAL | Status: AC
  Administered 2016-01-18: 5 mg via ORAL
  Filled 2016-01-18: qty 1

## 2016-01-18 MED ORDER — OXYCODONE-ACETAMINOPHEN 5-325 MG PO TABS: 2.0000 | ORAL_TABLET | Freq: Four times a day (QID) | ORAL | 0 refills | Status: DC | PRN

## 2016-01-18 MED ORDER — HYDROMORPHONE HCL 1 MG/ML IJ SOLN
1.0000 mg | Freq: Once | INTRAMUSCULAR | Status: AC
  Administered 2016-01-18: 1 mg via INTRAVENOUS

## 2016-01-18 MED ORDER — DIAZEPAM 5 MG PO TABS
5.0000 mg | ORAL_TABLET | Freq: Once | ORAL | Status: AC
  Administered 2016-01-18: 5 mg via ORAL

## 2016-01-18 MED ORDER — SODIUM CHLORIDE 0.9 % IV SOLN
1000.0000 mL | Freq: Once | INTRAVENOUS | Status: AC
  Administered 2016-01-18: 1000 mL via INTRAVENOUS

## 2016-01-18 MED ORDER — HYDROMORPHONE HCL 1 MG/ML IJ SOLN
1.0000 mg | Freq: Once | INTRAMUSCULAR | Status: AC
  Administered 2016-01-18: 1 mg via INTRAVENOUS
  Filled 2016-01-18: qty 1

## 2016-01-18 NOTE — ED Triage Notes (Signed)
Pt reports vaginal bleeding that began 3 days ago, this is time for her period but pt reports this is much heavier than usual.

## 2016-01-18 NOTE — ED Provider Notes (Addendum)
[facility] Emergency Department Provider Note        Time seen: ----------------------------------------- 9:52 AM on 01/18/2016 -----------------------------------------    I have reviewed the triage vital signs and the nursing notes.   HISTORY  Chief Complaint Vaginal Bleeding    HPI Kelsey MatasKyrsten E Serrano is a 20 y.o. female who presents to ER for vaginal bleeding that began 3 days ago. Patient states she is 2 weeks late for her menstrual cycle but reports the bleeding is much heavier than normal. Patient's complains of severe lower abdominal cramping, worse in the left lower quadrant. She denies fevers, chills or other complaints.   Past Medical History:  Diagnosis Date  . Abuse, drug or alcohol 11/07/2010   Overview:  A.  Never IVDA.   prescription drugs, THC, methamphetamines.   B.   Denies any since March 2013.  No methamphetamines 04/24/2009 Addendum Sheppard PentonWolf- [facility] check is negative, reviewed with PCP do not think current problem   . 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  No prior ED visits or hospitalization   . Asthma   . Calculus of kidney 10/25/2014  . Chronic cough 01/01/2012   Overview:  A.  "smokers cough"   . Complication of anesthesia    BP and HR were low after lithotripsy([facility])  . D (diarrhea) 10/25/2014  . Depression, major, recurrent (HCC) 10/02/2011  . Hypothyroidism    when younger - resolved  . Impacted tooth    current - wisdon tooth  . 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  Depression, PTSA, mood disorder NOS B.  Psychiatrist:  Mathews Argyleachel Dew C.  Therapist:  Elita Quickheryl Lawson   . Renal colic 10/25/2014  . Renal disorder   . Restless leg syndrome     Patient Active Problem List   Diagnosis Date Noted  . Medication side effect 06/30/2015  . Chronic diarrhea of unknown origin   . Nausea with vomiting   . Gastritis   . D (diarrhea) 10/25/2014  . Abdominal pain, epigastric 10/25/2014  . Calculus of kidney 10/25/2014  .  Renal colic 10/25/2014  . Current smoker 04/12/2013  . Chronic cough 01/01/2012  . 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 Depression, major, recurrent (HCC) 10/02/2011  . 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 Abuse, drug or alcohol 11/07/2010    Past Surgical History:  Procedure Laterality Date  . COLONOSCOPY WITH PROPOFOL N/A 12/04/2014   Procedure: COLONOSCOPY WITH PROPOFOL;  Surgeon: Midge Miniumarren Wohl, MD;  [facility];  Service: Endoscopy;  Laterality: N/A;  . ESOPHAGOGASTRODUODENOSCOPY (EGD) WITH PROPOFOL N/A 12/04/2014   Procedure: ESOPHAGOGASTRODUODENOSCOPY (EGD) WITH PROPOFOL;  Surgeon: Midge Miniumarren Wohl, MD;  [facility];  Service: Endoscopy;  Laterality: N/A;  . kidney stent    . kidney stones Left   . LITHOTRIPSY      Allergies Augmentin [amoxicillin-pot clavulanate]; Estrogens; Lavender oil; and Zyrtec [cetirizine]  Social History Social History  Substance Use Topics  . Smoking status: Current Every Day Smoker    Packs/day: 0.25    Years: 7.00    Types: Cigarettes  . Smokeless tobacco: Never Used  . Alcohol use Yes     Comment: rare- couple times per year    Review of Systems Constitutional: Negative for fever. Cardiovascular: Negative for chest pain. Respiratory: Negative for shortness of breath. Gastrointestinal: Positive for abdominal pain Genitourinary: Positive for vaginal bleeding Musculoskeletal: Negative for back pain. Skin: Negative for rash. Neurological: Negative for headaches, focal weakness or numbness.  10-point ROS otherwise  negative.  ____________________________________________   PHYSICAL EXAM:  VITAL SIGNS: ED Triage Vitals [01/18/16 0917]  Enc Vitals Group     BP 134/78     Pulse Rate (!) 53     Resp 18     Temp 98.5 F (36.9 C)     Temp Source Oral     SpO2 100 %     Weight 130 lb (59 kg)     Height 5\' 3"  (1.6 m)     Head Circumference      Peak Flow      Pain Score 7     Pain Loc      Pain Edu?       Excl. in GC?     Constitutional: Alert and oriented. Mild distress, anxious Eyes: Conjunctivae are normal. PERRL. Normal extraocular movements. ENT   Head: Normocephalic and atraumatic.   Nose: No congestion/rhinnorhea.   Mouth/Throat: Mucous membranes are moist.   Neck: No stridor. Cardiovascular: Normal rate, regular rhythm. No murmurs, rubs, or gallops. Respiratory: Normal respiratory effort without tachypnea nor retractions. Breath sounds are clear and equal bilaterally. No wheezes/rales/rhonchi. Gastrointestinal: Lower abdominal tenderness, worse in the left lower quadrant, no rebound or guarding. Normal bowel sounds. Genitourinary: Mild vaginal bleeding, no clots. Cervix is unremarkable. Musculoskeletal: Nontender with normal range of motion in all extremities. No lower extremity tenderness nor edema. Neurologic:  Normal speech and language. No gross focal neurologic deficits are appreciated.  Skin:  Skin is warm, dry and intact. No rash noted. Psychiatric: Mood and affect are normal. Speech and behavior are normal.  ___________________________________________  ED COURSE:  Pertinent labs & imaging results that were available during my care of the patient were reviewed by me and considered in my medical decision making (see chart for details). Clinical Course   Patient presents with abdominal pain, vaginal bleeding. We will assess with labs and ultrasound.  Procedures ____________________________________________   LABS (pertinent positives/negatives)  Labs Reviewed  COMPREHENSIVE METABOLIC PANEL - Abnormal; Notable for the following:       Result Value   Calcium 8.8 (*)    ALT 11 (*)    All other components within normal limits  WET PREP, GENITAL  CHLAMYDIA/NGC RT PCR ([facility] ONLY)  CBC WITH DIFFERENTIAL/PLATELET  URINALYSIS COMPLETEWITH MICROSCOPIC ([facility] ONLY)  POC URINE PREG, ED  POCT PREGNANCY, URINE    RADIOLOGY  Pelvic  ultrasound IMPRESSION: Endometrial complex measures 15 mm, within normal limits.  Negative pelvic ultrasound.  No evidence of ovarian torsion. ____________________________________________  FINAL ASSESSMENT AND PLAN  Abnormal vaginal bleeding, Dysmenorrhea  Plan: Patient with labs and imaging as dictated above. Patient's no distress, labs are reassuring. Pain seems out of proportion to her examination and bleeding. Ultrasound did not reveal any acute pathology. She appeared to be very anxious throughout her stay but declined anxiolytics. Advised do not have a clear reason for her pain, She'll be discharged with pain medicine and close OB/GYN outpatient follow-up.   Emily FilbertWilliams, Riven Mabile E, MD   Note: This dictation was prepared with Dragon dictation. Any transcriptional errors that result from this process are unintentional    Emily FilbertJonathan E Talya Quain, MD 01/18/16 1214    Emily FilbertJonathan E Olando Willems, MD 01/18/16 1311

## 2016-01-18 NOTE — ED Notes (Signed)
Pt continues to c/o pain and headache. Requesting additional medication. Lights dimmed for comfort, patient already has blanket.

## 2016-03-24 ENCOUNTER — Emergency Department
Admission: EM | Admit: 2016-03-24 | Discharge: 2016-03-24 | Disposition: A | Discharge status: Home / Self Care | Attending: Emergency Medicine | Admitting: Emergency Medicine

## 2016-03-24 ENCOUNTER — Encounter: Payer: Self-pay | Admitting: Emergency Medicine

## 2016-03-24 DIAGNOSIS — K0889 Other specified disorders of teeth and supporting structures: Secondary | ICD-10-CM

## 2016-03-24 DIAGNOSIS — F17210 Nicotine dependence, cigarettes, uncomplicated: Secondary | ICD-10-CM | POA: Insufficient documentation

## 2016-03-24 DIAGNOSIS — E039 Hypothyroidism, unspecified: Secondary | ICD-10-CM | POA: Insufficient documentation

## 2016-03-24 DIAGNOSIS — Z79899 Other long term (current) drug therapy: Secondary | ICD-10-CM | POA: Insufficient documentation

## 2016-03-24 DIAGNOSIS — K011 Impacted teeth: Secondary | ICD-10-CM | POA: Insufficient documentation

## 2016-03-24 DIAGNOSIS — J45909 Unspecified asthma, uncomplicated: Secondary | ICD-10-CM | POA: Insufficient documentation

## 2016-03-24 MED ORDER — IBUPROFEN 600 MG PO TABS
600.0000 mg | ORAL_TABLET | Freq: Once | ORAL | Status: AC
  Administered 2016-03-24: 600 mg via ORAL

## 2016-03-24 MED ORDER — TRAMADOL HCL 50 MG PO TABS
ORAL_TABLET | ORAL | Status: AC
  Filled 2016-03-24: qty 1

## 2016-03-24 MED ORDER — TRAMADOL HCL 50 MG PO TABS: 50.0000 mg | ORAL_TABLET | Freq: Four times a day (QID) | ORAL | 0 refills | Status: AC | PRN

## 2016-03-24 MED ORDER — TRAMADOL HCL 50 MG PO TABS
50.0000 mg | ORAL_TABLET | Freq: Once | ORAL | Status: AC
  Administered 2016-03-24: 50 mg via ORAL

## 2016-03-24 MED ORDER — LIDOCAINE VISCOUS 2 % MT SOLN
OROMUCOSAL | Status: AC
  Filled 2016-03-24: qty 15

## 2016-03-24 MED ORDER — IBUPROFEN 600 MG PO TABS
ORAL_TABLET | ORAL | Status: AC
  Filled 2016-03-24: qty 1

## 2016-03-24 MED ORDER — LIDOCAINE VISCOUS 2 % MT SOLN
15.0000 mL | Freq: Once | OROMUCOSAL | Status: AC
  Administered 2016-03-24: 15 mL via OROMUCOSAL

## 2016-03-24 MED ORDER — CLINDAMYCIN HCL 150 MG PO CAPS: 150.0000 mg | ORAL_CAPSULE | Freq: Four times a day (QID) | ORAL | 0 refills | Status: DC

## 2016-03-24 MED ORDER — IBUPROFEN 600 MG PO TABS: 600.0000 mg | ORAL_TABLET | Freq: Three times a day (TID) | ORAL | 0 refills | Status: DC | PRN

## 2016-03-24 NOTE — ED Provider Notes (Signed)
[facility] Emergency Department Provider Note   ____________________________________________   First MD Initiated Contact with Patient 03/24/16 [phone]     (approximate)  I have reviewed the triage vital signs and the nursing notes.   HISTORY  Chief Complaint Dental Pain   HPI Kelsey Serrano is a 21 y.o. female patient complain right lobe was sent to pain from an half weeks. Patient stated no relief over-the-counter medications. Patient has a Education officer, communitydentist. Patient stated pain increases with eating. Patient rates the pain as 7/10. Patient described a pain as "achy".   Past Medical History:  Diagnosis Date  . Abuse, drug or alcohol 11/07/2010   Overview:  A.  Never IVDA.   prescription drugs, THC, methamphetamines.   B.   Denies any since March 2013.  No methamphetamines 04/24/2009 Addendum Sheppard PentonWolf- NCCSR check is negative, reviewed with PCP do not think current problem   . 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  No prior ED visits or hospitalization   . Asthma   . Calculus of kidney 10/25/2014  . Chronic cough 01/01/2012   Overview:  A.  "smokers cough"   . Complication of anesthesia    BP and HR were low after lithotripsy([facility])  . D (diarrhea) 10/25/2014  . Depression, major, recurrent (HCC) 10/02/2011  . Hypothyroidism    when younger - resolved  . Impacted tooth    current - wisdon tooth  . 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  Depression, PTSA, mood disorder NOS B.  Psychiatrist:  Mathews Argyleachel Dew C.  Therapist:  Elita Quickheryl Lawson   . Renal colic 10/25/2014  . Renal disorder   . Restless leg syndrome     Patient Active Problem List   Diagnosis Date Noted  . Medication side effect 06/30/2015  . Chronic diarrhea of unknown origin   . Nausea with vomiting   . Gastritis   . D (diarrhea) 10/25/2014  . Abdominal pain, epigastric 10/25/2014  . Calculus of kidney 10/25/2014  . Renal colic 10/25/2014  . Current smoker 04/12/2013  . Chronic  cough 01/01/2012  . 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 Depression, major, recurrent (HCC) 10/02/2011  . 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 Abuse, drug or alcohol 11/07/2010    Past Surgical History:  Procedure Laterality Date  . COLONOSCOPY WITH PROPOFOL N/A 12/04/2014   Procedure: COLONOSCOPY WITH PROPOFOL;  Surgeon: Midge Miniumarren Wohl, MD;  [facility];  Service: Endoscopy;  Laterality: N/A;  . ESOPHAGOGASTRODUODENOSCOPY (EGD) WITH PROPOFOL N/A 12/04/2014   Procedure: ESOPHAGOGASTRODUODENOSCOPY (EGD) WITH PROPOFOL;  Surgeon: Midge Miniumarren Wohl, MD;  [facility];  Service: Endoscopy;  Laterality: N/A;  . kidney stent    . kidney stones Left   . LITHOTRIPSY      Prior to Admission medications   Medication Sig Start Date End Date Taking? Authorizing Provider  acetaminophen (TYLENOL) 325 MG tablet Take 650 mg by mouth every 6 (six) hours as needed for mild pain, moderate pain, fever or headache. Reported on 06/30/2015    Historical Provider, MD  ALBUTEROL IN Inhale into the lungs.    Historical Provider, MD  amoxicillin (AMOXIL) 500 MG capsule Take 1 capsule (500 mg total) by mouth 2 (two) times daily. 06/30/15   Hildred LaserAnika Cherry, MD  clindamycin (CLEOCIN) 150 MG capsule Take 1 capsule (150 mg total) by mouth 4 (four) times daily. 03/24/16   Joni Reiningonald K Smith, PA-C  dicyclomine (BENTYL) 20 MG tablet Take 1 tablet (20 mg total) by mouth 3 (  three) times daily as needed for spasms. Patient not taking: Reported on 06/30/2015 12/06/14 12/06/15  Myrna Blazer, MD  esomeprazole (NEXIUM) 20 MG capsule Take 1 capsule (20 mg total) by mouth daily. 08/05/14   Sharyn Creamer, MD  HYDROcodone-acetaminophen (NORCO/VICODIN) 5-325 MG per tablet Take 1 tablet by mouth every 6 (six) hours as needed for moderate pain. Patient not taking: Reported on 12/06/2014 08/05/14   Sharyn Creamer, MD  ibuprofen (ADVIL,MOTRIN) 600 MG tablet Take 1 tablet (600 mg total) by mouth every 8 (eight) hours as needed.  03/24/16   Joni Reining, PA-C  ibuprofen (ADVIL,MOTRIN) 800 MG tablet Take 1 tablet (800 mg total) by mouth every 8 (eight) hours as needed. 01/18/16   Emily Filbert, MD  metoCLOPramide (REGLAN) 10 MG tablet Take 1 tablet (10 mg total) by mouth every 6 (six) hours as needed for nausea or vomiting. Patient not taking: Reported on 06/30/2015 12/06/14   Myrna Blazer, MD  oxyCODONE-acetaminophen (PERCOCET) 5-325 MG tablet Take 2 tablets by mouth every 6 (six) hours as needed for moderate pain or severe pain. 01/18/16   Emily Filbert, MD  Prenatal Vit-Fe Fumarate-FA (PNV PRENATAL PLUS MULTIVITAMIN) 27-1 MG TABS Take 1 tablet by mouth daily. 11/16/14   Historical Provider, MD  promethazine (PHENERGAN) 25 MG tablet Take 1 tablet (25 mg total) by mouth every 6 (six) hours as needed for nausea or vomiting. 06/30/15   Hildred Laser, MD  tamsulosin (FLOMAX) 0.4 MG CAPS capsule Take 0.4 mg by mouth daily.    Historical Provider, MD  traMADol (ULTRAM) 50 MG tablet Take 1 tablet (50 mg total) by mouth every 6 (six) hours as needed. 03/24/16 03/24/17  Joni Reining, PA-C    Allergies Augmentin [amoxicillin-pot clavulanate]; Estrogens; Lavender oil; and Zyrtec [cetirizine]  Family History  Problem Relation Age of Onset  . Diabetes Mother     Social History Social History  Substance Use Topics  . Smoking status: Current Every Day Smoker    Packs/day: 0.25    Years: 7.00    Types: Cigarettes  . Smokeless tobacco: Never Used  . Alcohol use No    Review of Systems Constitutional: No fever/chills Eyes: No visual changes. ENT: No sore throat.Dental pain Cardiovascular: Denies chest pain. Respiratory: Denies shortness of breath. Gastrointestinal: No abdominal pain.  No nausea, no vomiting.  No diarrhea.  No constipation. Genitourinary: Negative for dysuria. Musculoskeletal: Negative for back pain. Skin: Negative for rash. Neurological: Negative for headaches, focal weakness or  numbness. Psychiatric: Depression  Allergic/Immunilogical: Augmentin   ____________________________________________   PHYSICAL EXAM:  VITAL SIGNS: ED Triage Vitals [03/24/16 0240]  Enc Vitals Group     BP (!) 136/53     Pulse Rate 94     Resp 18     Temp 97.9 F (36.6 C)     Temp Source Oral     SpO2 98 %     Weight 140 lb (63.5 kg)     Height 5\' 3"  (1.6 m)     Head Circumference      Peak Flow      Pain Score 7     Pain Loc      Pain Edu?      Excl. in GC?     Constitutional: Alert and oriented. Well appearing and in no acute distress. Eyes: Conjunctivae are normal. PERRL. EOMI. Head: Atraumatic. Nose: No congestion/rhinnorhea. Mouth/Throat: Mucous membranes are moist.  Oropharynx non-erythematous.Edematous gingiva around tooth #29 Neck: No stridor. No  cervical spine tenderness to palpation. Hematological/Lymphatic/Immunilogical: No cervical lymphadenopathy. Cardiovascular: Normal rate, regular rhythm. Grossly normal heart sounds.  Good peripheral circulation. Respiratory: Normal respiratory effort.  No retractions. Lungs CTAB. Gastrointestinal: Soft and nontender. No distention. No abdominal bruits. No CVA tenderness. Musculoskeletal: No lower extremity tenderness nor edema.  No joint effusions. Neurologic:  Normal speech and language. No gross focal neurologic deficits are appreciated. No gait instability. Skin:  Skin is warm, dry and intact. No rash noted. Psychiatric: Mood and affect are normal. Speech and behavior are normal.  ____________________________________________   LABS (all labs ordered are listed, but only abnormal results are displayed)  Labs Reviewed - No data to display ____________________________________________  EKG   ____________________________________________  RADIOLOGY   ____________________________________________   PROCEDURES  Procedure(s) performed: None  Procedures  Critical Care performed:  No  ____________________________________________   INITIAL IMPRESSION / ASSESSMENT AND PLAN / ED COURSE  Pertinent labs & imaging results that were available during my care of the patient were reviewed by me and considered in my medical decision making (see chart for details).  Dental pain secondary to impacted right was tooth. Patient given discharge care instructions. Patient get a prescription for clindamycin, tramadol and ibuprofen. Patient advised follow [facility] for definitive evaluation and treatment.      ____________________________________________   FINAL CLINICAL IMPRESSION(S) / ED DIAGNOSES  Final diagnoses:  Pain, dental      NEW MEDICATIONS STARTED DURING THIS VISIT:  New Prescriptions   CLINDAMYCIN (CLEOCIN) 150 MG CAPSULE    Take 1 capsule (150 mg total) by mouth 4 (four) times daily.   IBUPROFEN (ADVIL,MOTRIN) 600 MG TABLET    Take 1 tablet (600 mg total) by mouth every 8 (eight) hours as needed.   TRAMADOL (ULTRAM) 50 MG TABLET    Take 1 tablet (50 mg total) by mouth every 6 (six) hours as needed.     Note:  This document was prepared using Dragon voice recognition software and may include unintentional dictation errors.    Joni Reining, PA-C 03/24/16 [id number]    Rockne Menghini, MD 03/24/16 [phone]

## 2016-03-24 NOTE — ED Triage Notes (Signed)
Patient complaint of right lower wisdom tooth pain times one and half weeks. Patient states that she has tried OTC medications with no relief.

## 2016-03-24 NOTE — ED Notes (Signed)
See triage note   States she has her wisdom teeth coming in  Having increased pain to right lower gumline  Min swelling noted  States pain started about 1 1/2 weeks ago

## 2016-03-31 ENCOUNTER — Emergency Department
Admission: EM | Admit: 2016-03-31 | Discharge: 2016-03-31 | Disposition: A | Discharge status: Home / Self Care | Attending: Emergency Medicine | Admitting: Emergency Medicine

## 2016-03-31 ENCOUNTER — Encounter: Payer: Self-pay | Admitting: Emergency Medicine

## 2016-03-31 DIAGNOSIS — F17210 Nicotine dependence, cigarettes, uncomplicated: Secondary | ICD-10-CM | POA: Insufficient documentation

## 2016-03-31 DIAGNOSIS — K0889 Other specified disorders of teeth and supporting structures: Secondary | ICD-10-CM | POA: Insufficient documentation

## 2016-03-31 DIAGNOSIS — J45909 Unspecified asthma, uncomplicated: Secondary | ICD-10-CM | POA: Insufficient documentation

## 2016-03-31 DIAGNOSIS — E039 Hypothyroidism, unspecified: Secondary | ICD-10-CM | POA: Insufficient documentation

## 2016-03-31 DIAGNOSIS — Z5321 Procedure and treatment not carried out due to patient leaving prior to being seen by health care provider: Secondary | ICD-10-CM | POA: Insufficient documentation

## 2016-03-31 NOTE — ED Triage Notes (Signed)
Patient reports that she had her right lower wisdom tooth pulled 4 days ago. Patient reports that she been having pain since. Patient states that she was given hydrocodone but it is not helping her pain.

## 2017-08-26 ENCOUNTER — Emergency Department

## 2017-08-26 ENCOUNTER — Emergency Department
Admission: EM | Admit: 2017-08-26 | Discharge: 2017-08-26 | Disposition: A | Discharge status: Home / Self Care | Attending: Emergency Medicine | Admitting: Emergency Medicine

## 2017-08-26 ENCOUNTER — Other Ambulatory Visit: Payer: Self-pay

## 2017-08-26 ENCOUNTER — Encounter: Payer: Self-pay | Admitting: *Deleted

## 2017-08-26 DIAGNOSIS — J45909 Unspecified asthma, uncomplicated: Secondary | ICD-10-CM | POA: Insufficient documentation

## 2017-08-26 DIAGNOSIS — M79672 Pain in left foot: Secondary | ICD-10-CM

## 2017-08-26 DIAGNOSIS — Z23 Encounter for immunization: Secondary | ICD-10-CM | POA: Insufficient documentation

## 2017-08-26 DIAGNOSIS — E039 Hypothyroidism, unspecified: Secondary | ICD-10-CM | POA: Insufficient documentation

## 2017-08-26 DIAGNOSIS — Z79899 Other long term (current) drug therapy: Secondary | ICD-10-CM | POA: Insufficient documentation

## 2017-08-26 DIAGNOSIS — F17210 Nicotine dependence, cigarettes, uncomplicated: Secondary | ICD-10-CM | POA: Insufficient documentation

## 2017-08-26 MED ORDER — TETANUS-DIPHTH-ACELL PERTUSSIS 5-2.5-18.5 LF-MCG/0.5 IM SUSP
0.5000 mL | Freq: Once | INTRAMUSCULAR | Status: AC
  Administered 2017-08-26: 0.5 mL via INTRAMUSCULAR
  Filled 2017-08-26: qty 0.5

## 2017-08-26 MED ORDER — CEPHALEXIN 500 MG PO CAPS: 500.0000 mg | ORAL_CAPSULE | Freq: Three times a day (TID) | ORAL | 0 refills | Status: DC

## 2017-08-26 NOTE — ED Triage Notes (Addendum)
Pt has left foot pain.  Pt states she stepped on a tac 1 day ago.  Pt states painful to ambulate.   Pt states she got pus out of foot today.

## 2017-08-26 NOTE — ED Provider Notes (Signed)
[facility] Emergency Department Provider Note  ____________________________________________  Time seen: Approximately 1:58 AM  I have reviewed the triage vital signs and the nursing notes.   HISTORY  Chief Complaint Foot Pain    HPI Kelsey Serrano is a 22 y.o. female reports that she stepped on a thumbtack 1 day ago.  She has ongoing pain in the ball of the left foot from where she stepped on the tach.  It was not embedded.  Today she did feel like she had some purulent drainage from the area although it was scant.  No fever.  She reports redness of the area of the foot.  No calf swelling.  Pain is constant worse with standing on it and putting pressure on the foot, no alleviating factors, moderate intensity.   Last tetanus 9 years ago   Past Medical History:  Diagnosis Date  . Abuse, drug or alcohol (HCC) 11/07/2010   Overview:  A.  Never IVDA.   prescription drugs, THC, methamphetamines.   B.   Denies any since March 2013.  No methamphetamines 04/24/2009 Addendum [name]- NCCSR check is negative, reviewed with PCP do not think current problem   . 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  No prior ED visits or hospitalization   . Asthma   . Calculus of kidney 10/25/2014  . Chronic cough 01/01/2012   Overview:  A.  "smokers cough"   . Complication of anesthesia    BP and HR were low after lithotripsy([facility])  . D (diarrhea) 10/25/2014  . Depression, major, recurrent (HCC) 10/02/2011  . Hypothyroidism    when younger - resolved  . Impacted tooth    current - wisdon tooth  . 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  Depression, PTSA, mood disorder NOS B.  Psychiatrist:  Mathews Argyle C.  Therapist:  Elita Quick   . Renal colic 10/25/2014  . Renal disorder   . Restless leg syndrome      Patient Active Problem List   Diagnosis Date Noted  . Medication side effect 06/30/2015  . Chronic diarrhea of unknown origin   . Nausea with vomiting   .  Gastritis   . D (diarrhea) 10/25/2014  . Abdominal pain, epigastric 10/25/2014  . Calculus of kidney 10/25/2014  . Renal colic 10/25/2014  . Current smoker 04/12/2013  . Chronic cough 01/01/2012  . 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 Depression, major, recurrent (HCC) 10/02/2011  . 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 Abuse, drug or alcohol (HCC) 11/07/2010     Past Surgical History:  Procedure Laterality Date  . COLONOSCOPY WITH PROPOFOL N/A 12/04/2014   Procedure: COLONOSCOPY WITH PROPOFOL;  Surgeon: Midge Minium, MD;  Location: [facility];  Service: Endoscopy;  Laterality: N/A;  . DENTAL SURGERY    . ESOPHAGOGASTRODUODENOSCOPY (EGD) WITH PROPOFOL N/A 12/04/2014   Procedure: ESOPHAGOGASTRODUODENOSCOPY (EGD) WITH PROPOFOL;  Surgeon: Midge Minium, MD;  Location: [facility];  Service: Endoscopy;  Laterality: N/A;  . kidney stent    . kidney stones Left   . LITHOTRIPSY       Prior to Admission medications   Medication Sig Start Date End Date Taking? Authorizing Provider  acetaminophen (TYLENOL) 325 MG tablet Take 650 mg by mouth every 6 (six) hours as needed for mild pain, moderate pain, fever or headache. Reported on 06/30/2015    [provider]  ALBUTEROL IN Inhale into the lungs.    [provider]  amoxicillin (AMOXIL) 500 MG capsule Take  1 capsule (500 mg total) by mouth 2 (two) times daily. 06/30/15   Hildred Laserherry, Anika, MD  cephALEXin (KEFLEX) 500 MG capsule Take 1 capsule (500 mg total) by mouth 3 (three) times daily. 08/26/17   Sharman CheekStafford, Tiawanna Luchsinger, MD  clindamycin (CLEOCIN) 150 MG capsule Take 1 capsule (150 mg total) by mouth 4 (four) times daily. 03/24/16   Joni ReiningSmith, Ronald K, PA-C  dicyclomine (BENTYL) 20 MG tablet Take 1 tablet (20 mg total) by mouth 3 (three) times daily as needed for spasms. Patient not taking: Reported on 06/30/2015 12/06/14 12/06/15  Myrna BlazerSchaevitz, David Matthew, MD  esomeprazole (NEXIUM) 20 MG capsule Take 1 capsule (20 mg total) by  mouth daily. 08/05/14   Sharyn CreamerQuale, Mark, MD  HYDROcodone-acetaminophen (NORCO/VICODIN) 5-325 MG per tablet Take 1 tablet by mouth every 6 (six) hours as needed for moderate pain. Patient not taking: Reported on 12/06/2014 08/05/14   Sharyn CreamerQuale, Mark, MD  ibuprofen (ADVIL,MOTRIN) 600 MG tablet Take 1 tablet (600 mg total) by mouth every 8 (eight) hours as needed. 03/24/16   Joni ReiningSmith, Ronald K, PA-C  ibuprofen (ADVIL,MOTRIN) 800 MG tablet Take 1 tablet (800 mg total) by mouth every 8 (eight) hours as needed. 01/18/16   Emily FilbertWilliams, Jonathan E, MD  metoCLOPramide (REGLAN) 10 MG tablet Take 1 tablet (10 mg total) by mouth every 6 (six) hours as needed for nausea or vomiting. Patient not taking: Reported on 06/30/2015 12/06/14   Myrna BlazerSchaevitz, David Matthew, MD  oxyCODONE-acetaminophen (PERCOCET) 5-325 MG tablet Take 2 tablets by mouth every 6 (six) hours as needed for moderate pain or severe pain. 01/18/16   Emily FilbertWilliams, Jonathan E, MD  Prenatal Vit-Fe Fumarate-FA (PNV PRENATAL PLUS MULTIVITAMIN) 27-1 MG TABS Take 1 tablet by mouth daily. 11/16/14   [provider]  promethazine (PHENERGAN) 25 MG tablet Take 1 tablet (25 mg total) by mouth every 6 (six) hours as needed for nausea or vomiting. 06/30/15   Hildred Laserherry, Anika, MD  tamsulosin (FLOMAX) 0.4 MG CAPS capsule Take 0.4 mg by mouth daily.    [provider]     Allergies Augmentin [amoxicillin-pot clavulanate]; Estrogens; Lavender oil; and Zyrtec [cetirizine]   Family History  Problem Relation Age of Onset  . Diabetes Mother     Social History Social History   Tobacco Use  . Smoking status: Current Every Day Smoker    Packs/day: 0.25    Years: 7.00    Pack years: 1.75    Types: Cigarettes  . Smokeless tobacco: Never Used  Substance Use Topics  . Alcohol use: No  . Drug use: No    Review of Systems  Constitutional:   No fever or chills.   Cardiovascular:   No chest pain or syncope. Respiratory:   No dyspnea or cough. Gastrointestinal:    Negative for abdominal pain, vomiting and diarrhea.  Musculoskeletal: Left foot pain as above All other systems reviewed and are negative except as documented above in ROS and HPI.  ____________________________________________   PHYSICAL EXAM:  VITAL SIGNS: ED Triage Vitals  Enc Vitals Group     BP 08/26/17 0010 (!) 153/101     Pulse Rate 08/26/17 0010 (!) 116     Resp 08/26/17 0010 18     Temp 08/26/17 0010 98.6 F (37 C)     Temp Source 08/26/17 0010 Oral     SpO2 08/26/17 0010 99 %     Weight 08/26/17 0011 135 lb (61.2 kg)     Height 08/26/17 0011 5\' 2"  (1.575 m)  Head Circumference --      Peak Flow --      Pain Score 08/26/17 0011 9     Pain Loc --      Pain Edu? --      Excl. in GC? --     Vital signs reviewed, nursing assessments reviewed.   Constitutional:   Alert and oriented. Non-toxic appearance. Eyes:   Conjunctivae are normal. EOMI.[facility]      Head:   Normocephalic and atraumatic.    Cardiovascular:   RRR.  Respiratory:   No respiratory distress  Musculoskeletal:   Normal range of motion in all extremities.No lower extremity  Edema.  Left forefoot shows a approximately 3 mm area of swelling where it is tender from the apparent puncture wound.  No palpable foreign body.  No laceration.  No induration erythema drainage or warmth.  No streaking or lymphangitis.  No crepitus.  No purulence Neurologic:   Normal speech and language.  Motor grossly intact. No acute focal neurologic deficits are appreciated.  Skin:    Skin is warm, dry and intact. No rash noted.  No petechiae, purpura, or bullae.  ____________________________________________    LABS (pertinent positives/negatives) (all labs ordered are listed, but only abnormal results are displayed) Labs Reviewed - No data to display ____________________________________________   EKG    ____________________________________________    RADIOLOGY  Dg Foot Complete Left  Result Date:  08/26/2017 CLINICAL DATA:  Left foot pain after stepping on a tack 1 day ago. EXAM: LEFT FOOT - COMPLETE 3+ VIEW COMPARISON:  None. FINDINGS: There is no evidence of fracture or dislocation. No radiopaque foreign body. No bone destruction. No soft tissue mass or ulceration. There is no evidence of arthropathy. Small ossicles are noted across the dorsum of the talonavicular joint and off the medial malleolus. IMPRESSION: No acute osseous abnormality.  No radiopaque foreign body. Electronically Signed   By: Tollie Eth M.D.   On: 08/26/2017 00:58    ____________________________________________   PROCEDURES Procedures  ____________________________________________    CLINICAL IMPRESSION / ASSESSMENT AND PLAN / ED COURSE  Pertinent labs & imaging results that were available during my care of the patient were reviewed by me and considered in my medical decision making (see chart for details).    Patient well-appearing and nontoxic.  On my exam she is not tachycardic.  No evidence of sepsis.  She may have an early cellulitis, and with her symptoms I will start her on Keflex.  I will update her tetanus immunization.  Bulky dressing to pad the foot.  No evidence of necrotizing fasciitis or abscess.  Doubt osteomyelitis.  No fracture.  X-ray is unremarkable.      ____________________________________________   FINAL CLINICAL IMPRESSION(S) / ED DIAGNOSES    Final diagnoses:  Foot pain, left     ED Discharge Orders        Ordered    cephALEXin (KEFLEX) 500 MG capsule  3 times daily     08/26/17 0158      Portions of this note were generated with dragon dictation software. Dictation errors may occur despite best attempts at proofreading.    Sharman Cheek, MD 08/26/17 [phone]

## 2018-04-28 ENCOUNTER — Emergency Department (HOSPITAL_COMMUNITY)
Admission: EM | Admit: 2018-04-28 | Discharge: 2018-04-28 | Disposition: A | Discharge status: Home / Self Care | Payer: Self-pay | Attending: Emergency Medicine | Admitting: Emergency Medicine

## 2018-04-28 ENCOUNTER — Other Ambulatory Visit: Payer: Self-pay

## 2018-04-28 ENCOUNTER — Emergency Department (HOSPITAL_COMMUNITY): Payer: Self-pay

## 2018-04-28 ENCOUNTER — Encounter (HOSPITAL_COMMUNITY): Payer: Self-pay | Admitting: Emergency Medicine

## 2018-04-28 DIAGNOSIS — J45909 Unspecified asthma, uncomplicated: Secondary | ICD-10-CM | POA: Insufficient documentation

## 2018-04-28 DIAGNOSIS — J111 Influenza due to unidentified influenza virus with other respiratory manifestations: Secondary | ICD-10-CM

## 2018-04-28 DIAGNOSIS — K029 Dental caries, unspecified: Secondary | ICD-10-CM | POA: Insufficient documentation

## 2018-04-28 DIAGNOSIS — R69 Illness, unspecified: Secondary | ICD-10-CM

## 2018-04-28 DIAGNOSIS — F17210 Nicotine dependence, cigarettes, uncomplicated: Secondary | ICD-10-CM | POA: Insufficient documentation

## 2018-04-28 DIAGNOSIS — J101 Influenza due to other identified influenza virus with other respiratory manifestations: Secondary | ICD-10-CM | POA: Insufficient documentation

## 2018-04-28 DIAGNOSIS — Z79899 Other long term (current) drug therapy: Secondary | ICD-10-CM | POA: Insufficient documentation

## 2018-04-28 MED ORDER — GUAIFENESIN-CODEINE 100-10 MG/5ML PO SOLN
5.0000 mL | Freq: Once | ORAL | Status: AC
Start: 2018-04-28 — End: 2018-04-28
  Administered 2018-04-28: 5 mL via ORAL
  Filled 2018-04-28: qty 5

## 2018-04-28 MED ORDER — ALBUTEROL SULFATE (2.5 MG/3ML) 0.083% IN NEBU
5.0000 mg | INHALATION_SOLUTION | Freq: Once | RESPIRATORY_TRACT | Status: AC
  Administered 2018-04-28: 5 mg via RESPIRATORY_TRACT
  Filled 2018-04-28: qty 6

## 2018-04-28 MED ORDER — AMOXICILLIN 500 MG PO CAPS
500.0000 mg | ORAL_CAPSULE | Freq: Once | ORAL | Status: AC
  Administered 2018-04-28: 500 mg via ORAL
  Filled 2018-04-28: qty 1

## 2018-04-28 MED ORDER — KETOROLAC TROMETHAMINE 30 MG/ML IJ SOLN
30.0000 mg | Freq: Once | INTRAMUSCULAR | Status: AC
  Administered 2018-04-28: 30 mg via INTRAVENOUS
  Filled 2018-04-28: qty 1

## 2018-04-28 MED ORDER — AMOXICILLIN 500 MG PO CAPS: 500.0000 mg | ORAL_CAPSULE | Freq: Three times a day (TID) | ORAL | 0 refills | Status: DC

## 2018-04-28 MED ORDER — SODIUM CHLORIDE 0.9 % IV BOLUS (SEPSIS)
1000.0000 mL | Freq: Once | INTRAVENOUS | Status: AC
  Administered 2018-04-28: 1000 mL via INTRAVENOUS

## 2018-04-28 MED ORDER — ONDANSETRON HCL 4 MG/2ML IJ SOLN
4.0000 mg | Freq: Once | INTRAMUSCULAR | Status: AC
  Administered 2018-04-28: 4 mg via INTRAVENOUS
  Filled 2018-04-28: qty 2

## 2018-04-28 MED ORDER — IPRATROPIUM BROMIDE 0.02 % IN SOLN
0.5000 mg | Freq: Once | RESPIRATORY_TRACT | Status: DC
  Filled 2018-04-28: qty 2.5

## 2018-04-28 MED ORDER — ALBUTEROL SULFATE HFA 108 (90 BASE) MCG/ACT IN AERS
2.0000 | INHALATION_SPRAY | Freq: Once | RESPIRATORY_TRACT | Status: DC
  Filled 2018-04-28: qty 6.7

## 2018-04-28 NOTE — Discharge Instructions (Addendum)
You may use your albuterol inhaler 2-4 puffs every 2-4 hours as needed for shortness of breath and wheezing.  You may alternate Tylenol 1000 mg every 6 hours as needed for fever and pain and ibuprofen 800 mg every 8 hours as needed for fever and pain. Please rest and drink plenty of fluids. This is a viral illness causing your symptoms. You do not need antibiotics for a virus. You may use over-the-counter nasal saline spray and Afrin nasal saline spray as needed for nasal congestion. Please do not use Afrin for more than 3 days in a row. You may use guaifenesin and dextromethorphan as needed for cough.  You may use lozenges and Chloraseptic spray to help with sore throat.  Warm salt water gargles can also help with sore throat.  You may use over-the-counter Unisom (doxyalamine) or Benadryl to help with sleep.  Please note that some combination medicines such as DayQuil and NyQuil have multiple medications in them.  Please make sure you look at all labels to ensure that you are not taking too much of any one particular medication.  Symptoms from a virus may take 7-14 days to run its course.  We do not test for the flu from the emergency department as it takes hours for this test to come back and it would not change our management. The flu is treated like any other virus with supportive measures as listed above. At this time you are outside the treatment window for Tamiflu. Tamiflu has to be taken within the first 48 hours of symptoms.  Tamiflu has many side effects including nausea, vomiting and diarrhea.

## 2018-04-28 NOTE — ED Provider Notes (Signed)
TIME SEEN: 6:30 AM  CHIEF COMPLAINT: Flulike symptoms  HPI: Patient is a 23 year old female with history of substance abuse, asthma presents to the emergency department with complaints of chest pain with coughing and deep inspiration, sore throat, subjective fevers and chills, nausea and diarrhea.  Did not have a flu shot this year.  Is a smoker.  Does not have an inhaler for home.  Denies any vomiting.  No history of PE, DVT, exogenous estrogen use, recent fractures, surgery, trauma, hospitalization or prolonged travel. No lower extremity swelling or pain. No calf tenderness.  ROS: See HPI Constitutional: subjective fever  Eyes: no drainage  ENT: no runny nose   Cardiovascular:   chest pain  Resp: no SOB  GI: no vomiting GU: no dysuria Integumentary: no rash  Allergy: no hives  Musculoskeletal: no leg swelling  Neurological: no slurred speech ROS otherwise negative  PAST MEDICAL HISTORY/PAST SURGICAL HISTORY:  Past Medical History:  Diagnosis Date  . Abuse, drug or alcohol (HCC) 11/07/2010   Overview:  A.  Never IVDA.   prescription drugs, THC, methamphetamines.   B.   Denies any since March 2013.  No methamphetamines 04/24/2009 Addendum [name]- NCCSR check is negative, reviewed with PCP do not think current problem   . 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  No prior ED visits or hospitalization   . Asthma   . Calculus of kidney 10/25/2014  . Chronic cough 01/01/2012   Overview:  A.  "smokers cough"   . Complication of anesthesia    BP and HR were low after lithotripsy([facility])  . D (diarrhea) 10/25/2014  . Depression, major, recurrent (HCC) 10/02/2011  . Hypothyroidism    when younger - resolved  . Impacted tooth    current - wisdon tooth  . 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  Depression, PTSA, mood disorder NOS B.  Psychiatrist:  Mathews Argyle C.  Therapist:  Elita Quick   . Renal colic 10/25/2014  . Renal disorder   . Restless leg syndrome     MEDICATIONS:   Prior to Admission medications   Medication Sig Start Date End Date Taking? Authorizing Provider  acetaminophen (TYLENOL) 325 MG tablet Take 650 mg by mouth every 6 (six) hours as needed for mild pain, moderate pain, fever or headache. Reported on 06/30/2015    [provider]  ALBUTEROL IN Inhale into the lungs.    [provider]  amoxicillin (AMOXIL) 500 MG capsule Take 1 capsule (500 mg total) by mouth 2 (two) times daily. 06/30/15   Hildred Laser, MD  cephALEXin (KEFLEX) 500 MG capsule Take 1 capsule (500 mg total) by mouth 3 (three) times daily. 08/26/17   Sharman Cheek, MD  clindamycin (CLEOCIN) 150 MG capsule Take 1 capsule (150 mg total) by mouth 4 (four) times daily. 03/24/16   Joni Reining, PA-C  dicyclomine (BENTYL) 20 MG tablet Take 1 tablet (20 mg total) by mouth 3 (three) times daily as needed for spasms. Patient not taking: Reported on 06/30/2015 12/06/14 12/06/15  Myrna Blazer, MD  esomeprazole (NEXIUM) 20 MG capsule Take 1 capsule (20 mg total) by mouth daily. 08/05/14   Sharyn Creamer, MD  HYDROcodone-acetaminophen (NORCO/VICODIN) 5-325 MG per tablet Take 1 tablet by mouth every 6 (six) hours as needed for moderate pain. Patient not taking: Reported on 12/06/2014 08/05/14   Sharyn Creamer, MD  ibuprofen (ADVIL,MOTRIN) 600 MG tablet Take 1 tablet (600 mg total) by mouth every 8 (eight) hours as needed.  03/24/16   Joni Reining, PA-C  ibuprofen (ADVIL,MOTRIN) 800 MG tablet Take 1 tablet (800 mg total) by mouth every 8 (eight) hours as needed. 01/18/16   Emily Filbert, MD  metoCLOPramide (REGLAN) 10 MG tablet Take 1 tablet (10 mg total) by mouth every 6 (six) hours as needed for nausea or vomiting. Patient not taking: Reported on 06/30/2015 12/06/14   Myrna Blazer, MD  oxyCODONE-acetaminophen (PERCOCET) 5-325 MG tablet Take 2 tablets by mouth every 6 (six) hours as needed for moderate pain or severe pain. 01/18/16   Emily Filbert, MD   Prenatal Vit-Fe Fumarate-FA (PNV PRENATAL PLUS MULTIVITAMIN) 27-1 MG TABS Take 1 tablet by mouth daily. 11/16/14   [provider]  promethazine (PHENERGAN) 25 MG tablet Take 1 tablet (25 mg total) by mouth every 6 (six) hours as needed for nausea or vomiting. 06/30/15   Hildred Laser, MD  tamsulosin (FLOMAX) 0.4 MG CAPS capsule Take 0.4 mg by mouth daily.    [provider]    ALLERGIES:  Allergies  Allergen Reactions  . Augmentin [Amoxicillin-Pot Clavulanate] Nausea And Vomiting  . Estrogens Nausea And Vomiting    Pt states she is allergic to high doses of estrogens.  Tresa Res Oil Hives  . Zyrtec [Cetirizine] Nausea And Vomiting    SOCIAL HISTORY:  Social History   Tobacco Use  . Smoking status: Current Every Day Smoker    Packs/day: 0.25    Years: 7.00    Pack years: 1.75    Types: Cigarettes  . Smokeless tobacco: Never Used  Substance Use Topics  . Alcohol use: No    FAMILY HISTORY: Family History  Problem Relation Age of Onset  . Diabetes Mother     EXAM: BP (!) 136/99 (BP Location: Right Arm)   Pulse (!) 126   Temp 98.3 F (36.8 C) (Oral)   Resp 20   Ht  (1.575 m)   Wt 58.1 kg   LMP 04/15/2018 (Exact Date)   SpO2 100%   BMI 23.41 kg/m  CONSTITUTIONAL: Alert and oriented and responds appropriately to questions.  Appears uncomfortable but nontoxic, afebrile HEAD: Normocephalic EYES: Conjunctivae clear, pupils appear equal, EOMI ENT: normal nose; moist mucous membranes; No pharyngeal erythema or petechiae, no tonsillar hypertrophy or exudate, no uvular deviation, no unilateral swelling, no trismus or drooling, no muffled voice, normal phonation, no stridor, multiple dental caries present, no drainable dental abscess noted, no Ludwig's angina, tongue sits flat in the bottom of the mouth, no angioedema, no facial erythema or warmth, no facial swelling; no pain with movement of the neck. NECK: Supple, no meningismus, no nuchal rigidity, no  LAD  CARD: Regular and tachycardic; S1 and S2 appreciated; no murmurs, no clicks, no rubs, no gallops RESP: Normal chest excursion without splinting or tachypnea; patient will not take a deep breath as she states it is painful.  She has some rhonchorous breath sounds heard intermittently that clear with coughing.  No wheezing.  No rales.  No hypoxia or respiratory distress.  No increased work of breathing. ABD/GI: Normal bowel sounds; non-distended; soft, non-tender, no rebound, no guarding, no peritoneal signs, no hepatosplenomegaly BACK:  The back appears normal and is non-tender to palpation, there is no CVA tenderness EXT: Normal ROM in all joints; non-tender to palpation; no edema; normal capillary refill; no cyanosis, no calf tenderness or swelling    SKIN: Normal color for age and race; warm; no rash NEURO: Moves all extremities equally PSYCH: The patient's  mood and manner are appropriate. Grooming and personal hygiene are appropriate.  MEDICAL DECISION MAKING: Patient here with flulike symptoms.  She is tachycardic and appears uncomfortable here.  Her chest x-ray obtained in triage shows no acute abnormality.  I do not think this is a pulmonary embolus as she has no risk factors for PE.  Chest pain seems to be with coughing and deep inspiration.  Will obtain EKG.  Will treat symptomatically with Toradol, IV fluids, albuterol, guaifenesin, Zofran.  ED PROGRESS: Patient reports some improvement in symptoms.  She is now able to take deeper breaths and her lungs are clear.  Heart rate is in the low 100s.  Now complaining of dental pain.  On evaluation she has multiple dental caries.  Will discharge on amoxicillin and give outpatient dental follow-up.  Given albuterol inhaler to take home.  We discussed supportive care instructions and return precautions.   At this time, I do not feel there is any life-threatening condition present. I have reviewed and discussed all results (EKG, imaging, lab,  urine as appropriate) and exam findings with patient/family. I have reviewed nursing notes and appropriate previous records.  I feel the patient is safe to be discharged home without further emergent workup and can continue workup as an outpatient as needed. Discussed usual and customary return precautions. Patient/family verbalize understanding and are comfortable with this plan.  Outpatient follow-up has been provided as needed. All questions have been answered.      EKG Interpretation  Date/Time:  Wednesday April 28 2018 07:16:32 EST Ventricular Rate:  112 PR Interval:    QRS Duration: 97 QT Interval:  341 QTC Calculation: 466 R Axis:   90 Text Interpretation:  Sinus tachycardia Borderline right axis deviation No previous ECGs available Confirmed by Zadie Rhine ([id number]) on 04/28/2018 7:22:36 AM         , Layla Maw, DO 04/28/18 [phone]

## 2018-04-28 NOTE — ED Triage Notes (Addendum)
Pt reports SOB and nasal congestion for 2-3 days as well as chest pain upon inspiration. Pt reports a productive cough with thick brown sputum.

## 2018-04-28 NOTE — ED Notes (Signed)
ED Provider at bedside. 

## 2018-04-28 NOTE — ED Notes (Signed)
Patient verbalizes understanding of discharge instructions. Opportunity for questioning and answering were provided, patient discharged from ED. 

## 2019-03-04 NOTE — L&D Delivery Note (Signed)
Delivery Summary for Kelsey Serrano  Labor Events:   Preterm labor: No data found  Rupture date: 05/23/2019  Rupture time: 8:26 AM  Rupture type: Spontaneous Bulging bag of water  Fluid Color: Yellow Light Meconium  Induction: No data found  Augmentation: No data found  Complications: No data found  Cervical ripening: No data found No data found   No data found     Delivery:   Episiotomy: No data found  Lacerations: No data found  Repair suture: No data found  Repair # of packets: No data found  Blood loss (ml): 230   Information for the patient's newborn:  Nasiah, Polinsky [[id number]]    Delivery 05/23/2019 8:28 AM by  Vaginal, Spontaneous Sex:  female Gestational Age: [redacted]w[redacted]d Delivery Clinician:   Living?:         APGARS  One minute Five minutes Ten minutes  Skin color:        Heart rate:        Grimace:        Muscle tone:        Breathing:        Totals: 9  9      Presentation/position:      Resuscitation:   Cord information:    Disposition of cord blood:     Blood gases sent?  Complications:   Placenta: Delivered:       appearance Newborn Measurements: Weight: 5 lb 14.9 oz (2690 g)  Height: 19.69"  Head circumference:    Chest circumference:    Other providers:    Additional  information: Forceps:   Vacuum:   Breech:   Observed anomalies         Delivery Note At 8:28 AM a viable and healthy female was delivered via Vaginal, Spontaneous (Presentation:   Occiput Anterior) by nurse.  APGAR: 9, 9; weight 5 lb 14.9 oz (2690 g).  I arrived within 2 minutes after birth. Placenta status: Manual removal after cord avulsion occurred with gentle traction. Placenta located in vagina, removed intact.  Cord: 3 vessels with the following complications: Cord avulsion.  Cord pH: not obtained.  Delayed cord clamping observed. Cord blood collected.   Anesthesia: Epidural Episiotomy: None Lacerations: None Suture Repair: None Est. Blood Loss (mL): 230  Mom to  postpartum.  Baby to Couplet care / Skin to Skin.  Hildred Laser, MD 05/23/2019, 12:56 PM

## 2019-05-23 ENCOUNTER — Inpatient Hospital Stay: Payer: Medicaid Other | Admitting: Certified Registered Nurse Anesthetist

## 2019-05-23 ENCOUNTER — Inpatient Hospital Stay
Admission: EM | Admit: 2019-05-23 | Discharge: 2019-05-25 | DRG: 807 | Disposition: A | Discharge status: Home / Self Care | Payer: Medicaid Other | Attending: Obstetrics and Gynecology | Admitting: Obstetrics and Gynecology

## 2019-05-23 ENCOUNTER — Encounter: Payer: Self-pay | Admitting: Obstetrics and Gynecology

## 2019-05-23 ENCOUNTER — Other Ambulatory Visit: Payer: Self-pay

## 2019-05-23 DIAGNOSIS — O26893 Other specified pregnancy related conditions, third trimester: Secondary | ICD-10-CM | POA: Diagnosis present

## 2019-05-23 DIAGNOSIS — Z20822 Contact with and (suspected) exposure to covid-19: Secondary | ICD-10-CM | POA: Diagnosis present

## 2019-05-23 DIAGNOSIS — O99323 Drug use complicating pregnancy, third trimester: Secondary | ICD-10-CM | POA: Diagnosis not present

## 2019-05-23 DIAGNOSIS — O99334 Smoking (tobacco) complicating childbirth: Secondary | ICD-10-CM | POA: Diagnosis present

## 2019-05-23 DIAGNOSIS — F17210 Nicotine dependence, cigarettes, uncomplicated: Secondary | ICD-10-CM | POA: Diagnosis present

## 2019-05-23 DIAGNOSIS — F1510 Other stimulant abuse, uncomplicated: Secondary | ICD-10-CM | POA: Diagnosis not present

## 2019-05-23 DIAGNOSIS — Z3A39 39 weeks gestation of pregnancy: Secondary | ICD-10-CM

## 2019-05-23 DIAGNOSIS — O99324 Drug use complicating childbirth: Secondary | ICD-10-CM | POA: Diagnosis not present

## 2019-05-23 DIAGNOSIS — O99333 Smoking (tobacco) complicating pregnancy, third trimester: Secondary | ICD-10-CM | POA: Diagnosis not present

## 2019-05-23 DIAGNOSIS — F1210 Cannabis abuse, uncomplicated: Secondary | ICD-10-CM | POA: Diagnosis not present

## 2019-05-23 LAB — CBC
HCT: 39.6 % (ref 36.0–46.0)
Hemoglobin: 13.7 g/dL (ref 12.0–15.0)
MCH: 30.4 pg (ref 26.0–34.0)
MCHC: 34.6 g/dL (ref 30.0–36.0)
MCV: 87.8 fL (ref 80.0–100.0)
Platelets: 165 10*3/uL (ref 150–400)
RBC: 4.51 MIL/uL (ref 3.87–5.11)
RDW: 12.6 % (ref 11.5–15.5)
WBC: 12.9 10*3/uL — ABNORMAL HIGH (ref 4.0–10.5)
nRBC: 0 % (ref 0.0–0.2)

## 2019-05-23 LAB — URINE DRUG SCREEN, QUALITATIVE (ARMC ONLY)
Amphetamines, Ur Screen: POSITIVE — AB
Barbiturates, Ur Screen: NOT DETECTED
Benzodiazepine, Ur Scrn: NOT DETECTED
Cannabinoid 50 Ng, Ur ~~LOC~~: POSITIVE — AB
Cocaine Metabolite,Ur ~~LOC~~: NOT DETECTED
MDMA (Ecstasy)Ur Screen: NOT DETECTED
Methadone Scn, Ur: NOT DETECTED
Opiate, Ur Screen: NOT DETECTED
Phencyclidine (PCP) Ur S: NOT DETECTED
Tricyclic, Ur Screen: NOT DETECTED

## 2019-05-23 LAB — RESPIRATORY PANEL BY RT PCR (FLU A&B, COVID)
Influenza A by PCR: NEGATIVE
Influenza B by PCR: NEGATIVE
SARS Coronavirus 2 by RT PCR: NEGATIVE

## 2019-05-23 LAB — CHLAMYDIA/NGC RT PCR (ARMC ONLY)
Chlamydia Tr: NOT DETECTED
N gonorrhoeae: NOT DETECTED

## 2019-05-23 LAB — RAPID HIV SCREEN (HIV 1/2 AB+AG)
HIV 1/2 Antibodies: NONREACTIVE
HIV-1 P24 Antigen - HIV24: NONREACTIVE

## 2019-05-23 LAB — TYPE AND SCREEN
ABO/RH(D): O POS
Antibody Screen: NEGATIVE

## 2019-05-23 LAB — RPR: RPR Ser Ql: NONREACTIVE

## 2019-05-23 LAB — HEPATITIS B SURFACE ANTIGEN: Hepatitis B Surface Ag: NONREACTIVE

## 2019-05-23 MED ORDER — AMMONIA AROMATIC IN INHA
RESPIRATORY_TRACT | Status: AC
  Filled 2019-05-23: qty 10

## 2019-05-23 MED ORDER — LIDOCAINE HCL (PF) 1 % IJ SOLN
INTRAMUSCULAR | Status: AC
  Filled 2019-05-23: qty 30

## 2019-05-23 MED ORDER — PRENATAL MULTIVITAMIN CH
1.0000 | ORAL_TABLET | Freq: Every day | ORAL | Status: DC
  Administered 2019-05-23 – 2019-05-24 (×2): 1 via ORAL
  Filled 2019-05-23 (×2): qty 1

## 2019-05-23 MED ORDER — WITCH HAZEL-GLYCERIN EX PADS: 1.0000 "application " | MEDICATED_PAD | CUTANEOUS | Status: DC | PRN

## 2019-05-23 MED ORDER — BENZOCAINE-MENTHOL 20-0.5 % EX AERO: 1.0000 "application " | INHALATION_SPRAY | CUTANEOUS | Status: DC | PRN

## 2019-05-23 MED ORDER — LACTATED RINGERS IV SOLN
INTRAVENOUS | Status: DC
  Administered 2019-05-23: 500 mL via INTRAVENOUS

## 2019-05-23 MED ORDER — LIDOCAINE HCL (PF) 1 % IJ SOLN
INTRAMUSCULAR | Status: DC | PRN
  Administered 2019-05-23: 2 mL
  Administered 2019-05-23: 3 mL

## 2019-05-23 MED ORDER — ONDANSETRON HCL 4 MG/2ML IJ SOLN: 4.0000 mg | Freq: Four times a day (QID) | INTRAMUSCULAR | Status: DC | PRN

## 2019-05-23 MED ORDER — OXYTOCIN BOLUS FROM INFUSION
500.0000 mL | Freq: Once | INTRAVENOUS | Status: AC
  Administered 2019-05-23: 500 mL via INTRAVENOUS

## 2019-05-23 MED ORDER — OXYTOCIN 40 UNITS IN NORMAL SALINE INFUSION - SIMPLE MED
INTRAVENOUS | Status: AC
  Filled 2019-05-23: qty 1000

## 2019-05-23 MED ORDER — SOD CITRATE-CITRIC ACID 500-334 MG/5ML PO SOLN: 30.0000 mL | ORAL | Status: DC | PRN

## 2019-05-23 MED ORDER — DIPHENHYDRAMINE HCL 25 MG PO CAPS: 25.0000 mg | ORAL_CAPSULE | Freq: Four times a day (QID) | ORAL | Status: DC | PRN

## 2019-05-23 MED ORDER — COCONUT OIL OIL
1.0000 "application " | TOPICAL_OIL | Status: DC | PRN
  Filled 2019-05-23: qty 120

## 2019-05-23 MED ORDER — MISOPROSTOL 200 MCG PO TABS
ORAL_TABLET | ORAL | Status: AC
  Filled 2019-05-23: qty 4

## 2019-05-23 MED ORDER — ONDANSETRON HCL 4 MG/2ML IJ SOLN: 4.0000 mg | INTRAMUSCULAR | Status: DC | PRN

## 2019-05-23 MED ORDER — LIDOCAINE HCL (PF) 1 % IJ SOLN: 30.0000 mL | INTRAMUSCULAR | Status: DC | PRN

## 2019-05-23 MED ORDER — ZOLPIDEM TARTRATE 5 MG PO TABS: 5.0000 mg | ORAL_TABLET | Freq: Every evening | ORAL | Status: DC | PRN

## 2019-05-23 MED ORDER — LACTATED RINGERS IV SOLN: 500.0000 mL | INTRAVENOUS | Status: DC | PRN

## 2019-05-23 MED ORDER — FENTANYL 2.5 MCG/ML W/ROPIVACAINE 0.15% IN NS 100 ML EPIDURAL (ARMC)
EPIDURAL | Status: DC | PRN
  Administered 2019-05-23: 12 mL/h via EPIDURAL

## 2019-05-23 MED ORDER — IBUPROFEN 800 MG PO TABS
800.0000 mg | ORAL_TABLET | Freq: Four times a day (QID) | ORAL | Status: DC
  Administered 2019-05-23 – 2019-05-25 (×6): 800 mg via ORAL
  Filled 2019-05-23 (×5): qty 1

## 2019-05-23 MED ORDER — OXYTOCIN 10 UNIT/ML IJ SOLN
INTRAMUSCULAR | Status: AC
  Filled 2019-05-23: qty 2

## 2019-05-23 MED ORDER — IBUPROFEN 800 MG PO TABS
ORAL_TABLET | ORAL | Status: AC
  Filled 2019-05-23: qty 1

## 2019-05-23 MED ORDER — BUTORPHANOL TARTRATE 1 MG/ML IJ SOLN: 1.0000 mg | INTRAMUSCULAR | Status: DC | PRN

## 2019-05-23 MED ORDER — BUPIVACAINE HCL (PF) 0.25 % IJ SOLN
INTRAMUSCULAR | Status: DC | PRN
  Administered 2019-05-23: 2 mL via EPIDURAL
  Administered 2019-05-23: 5 mL via EPIDURAL

## 2019-05-23 MED ORDER — DIBUCAINE (PERIANAL) 1 % EX OINT: 1.0000 "application " | TOPICAL_OINTMENT | CUTANEOUS | Status: DC | PRN

## 2019-05-23 MED ORDER — TETANUS-DIPHTH-ACELL PERTUSSIS 5-2.5-18.5 LF-MCG/0.5 IM SUSP: 0.5000 mL | Freq: Once | INTRAMUSCULAR | Status: DC

## 2019-05-23 MED ORDER — SENNOSIDES-DOCUSATE SODIUM 8.6-50 MG PO TABS
2.0000 | ORAL_TABLET | ORAL | Status: DC
  Administered 2019-05-23: 2 via ORAL
  Filled 2019-05-23: qty 2

## 2019-05-23 MED ORDER — OXYTOCIN 40 UNITS IN NORMAL SALINE INFUSION - SIMPLE MED: 2.5000 [IU]/h | INTRAVENOUS | Status: DC

## 2019-05-23 MED ORDER — SIMETHICONE 80 MG PO CHEW: 80.0000 mg | CHEWABLE_TABLET | ORAL | Status: DC | PRN

## 2019-05-23 MED ORDER — ACETAMINOPHEN 325 MG PO TABS
650.0000 mg | ORAL_TABLET | ORAL | Status: DC | PRN
  Administered 2019-05-23 – 2019-05-25 (×3): 650 mg via ORAL
  Filled 2019-05-23 (×2): qty 2

## 2019-05-23 MED ORDER — FENTANYL 2.5 MCG/ML W/ROPIVACAINE 0.15% IN NS 100 ML EPIDURAL (ARMC)
EPIDURAL | Status: AC
  Filled 2019-05-23: qty 100

## 2019-05-23 MED ORDER — ONDANSETRON HCL 4 MG PO TABS: 4.0000 mg | ORAL_TABLET | ORAL | Status: DC | PRN

## 2019-05-23 MED ORDER — ACETAMINOPHEN 325 MG PO TABS
650.0000 mg | ORAL_TABLET | ORAL | Status: DC | PRN
  Filled 2019-05-23: qty 2

## 2019-05-23 NOTE — Lactation Note (Signed)
This note was copied from a baby's chart. Lactation Consultation Note  Patient Name: Kelsey Serrano DXAJO'I Date: 05/23/2019 Reason for consult: Follow-up assessment;Mother's request;Term;Other (Comment)(Baby tested positive for amphetamines and Marijuana)  Mom tried to breast feed again, but only get her to stay on the breast for 2 minutes of sucking. Mom initially wanted to pump, so DEBP kit and Symphony pump taken to room.  Mom changed her mind for this feeding because she was so tired and sleepy.  She did hand express and let LC spoon feed a tsp of expressed colostrum.  She decided to hold off on pumping until they drew serum glucose.  The serum glucose was 56 which was ok for this feeding.  Baby's urine drug screen was positive for amphetamines and marijuana, but mom is still only admitting to the marijuana.  Mom just wanting to get some sleep for now.  Reminded mom of lactation number on white board and to call if assistance needed.    Maternal Data Formula Feeding for Exclusion: No Has patient been taught Hand Expression?: Yes Does the patient have breastfeeding experience prior to this delivery?: Yes  Feeding Feeding Type: Breast Fed  LATCH Score Latch: Repeated attempts needed to sustain latch, nipple held in mouth throughout feeding, stimulation needed to elicit sucking reflex.  Audible Swallowing: A few with stimulation  Type of Nipple: Everted at rest and after stimulation  Comfort (Breast/Nipple): Soft / non-tender  Hold (Positioning): Assistance needed to correctly position infant at breast and maintain latch.  LATCH Score: 7  Interventions Interventions: Breast feeding basics reviewed;Assisted with latch;Skin to skin;Breast massage;Hand express;Breast compression;Adjust position;Support pillows;Position options;Expressed milk  Lactation Tools Discussed/Used WIC Program: Yes Pump Review: Setup, frequency, and cleaning;Milk Storage;Other (comment)   Consult  Status Consult Status: Follow-up Date: 05/23/19 Follow-up type: Call as needed    [name] 05/23/2019, 5:26 PM

## 2019-05-23 NOTE — H&P (Signed)
History and Physical   HPI  Kelsey Serrano is a 24 y.o. [id number] at [redacted]w[redacted]d Estimated Date of Delivery: 05/29/19 who is being admitted for labor management.  Pt had very limited prenatal care.  C/o of contractions for 2 days.  Two previous vaginal deliveries.  OB History  OB History  Gravida Para Term Preterm AB Living  3 2 2  0 0 1  SAB TAB Ectopic Multiple Live Births  0 0 0 0 0    # Outcome Date GA Lbr Len/2nd Weight Sex Delivery Anes PTL Lv  3 Current           2 Term 08/11/15     Vag-Spont     1 Term 01/22/14     Vag-Spont       PROBLEM LIST  Pregnancy complications or risks: Patient Active Problem List   Diagnosis Date Noted  . Normal labor 05/23/2019  . Medication side effect 06/30/2015  . Chronic diarrhea of unknown origin   . Nausea with vomiting   . Gastritis   . D (diarrhea) 10/25/2014  . Abdominal pain, epigastric 10/25/2014  . Calculus of kidney 10/25/2014  . Renal colic 10/25/2014  . Current smoker 04/12/2013  . Chronic cough 01/01/2012  . 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 Depression, major, recurrent (HCC) 10/02/2011  . 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 Abuse, drug or alcohol (HCC) 11/07/2010    Prenatal labs and studies: ABO, Rh:   Antibody:   Rubella:   RPR:    HBsAg:    HIV:    GBS:    Past Medical History:  Diagnosis Date  . Abuse, drug or alcohol (HCC) 11/07/2010   Overview:  A.  Never IVDA.   prescription drugs, THC, methamphetamines.   B.   Denies any since March 2013.  No methamphetamines 04/24/2009 Addendum 04/26/2009- NCCSR check is negative, reviewed with PCP do not think current problem   . 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  No prior ED visits or hospitalization   . Asthma   . Calculus of kidney 10/25/2014  . Chronic cough 01/01/2012   Overview:  A.  "smokers cough"   . Complication of anesthesia    BP and HR were low after lithotripsy([facility])  . D (diarrhea) 10/25/2014  . Depression, major, recurrent (HCC)  10/02/2011  . Hypothyroidism    when younger - resolved  . Impacted tooth    current - wisdon tooth  . 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  Depression, PTSA, mood disorder NOS B.  Psychiatrist:  12/02/2011 C.  Therapist:  Mathews Argyle   . Renal colic 10/25/2014  . Renal disorder   . Restless leg syndrome      Past Surgical History:  Procedure Laterality Date  . COLONOSCOPY WITH PROPOFOL N/A 12/04/2014   Procedure: COLONOSCOPY WITH PROPOFOL;  Surgeon: 02/03/2015, MD;  Location: [facility];  Service: Endoscopy;  Laterality: N/A;  . DENTAL SURGERY    . ESOPHAGOGASTRODUODENOSCOPY (EGD) WITH PROPOFOL N/A 12/04/2014   Procedure: ESOPHAGOGASTRODUODENOSCOPY (EGD) WITH PROPOFOL;  Surgeon: 02/03/2015, MD;  Location: [facility];  Service: Endoscopy;  Laterality: N/A;  . kidney stent    . kidney stones Left   . LITHOTRIPSY       Medications    Current Discharge Medication List    CONTINUE these medications which have NOT CHANGED   Details  Prenatal Vit-Fe Fumarate-FA (MULTIVITAMIN-PRENATAL) 27-0.8 MG TABS tablet Take 1 tablet by mouth daily  at 12 noon.    amoxicillin (AMOXIL) 500 MG capsule Take 1 capsule (500 mg total) by mouth 3 (three) times daily. Qty: 30 capsule, Refills: 0        Allergies  Augmentin [amoxicillin-pot clavulanate], Estrogens, Lavender oil, and Zyrtec [cetirizine]  Review of Systems  Pertinent items are noted in HPI.  Physical Exam  BP (!) 140/92 (BP Location: Right Arm)   Pulse 89   Temp 97.8 F (36.6 C) (Oral)   Resp 18   Ht 5\' 2"  (1.575 m)   Wt 75.3 kg   BMI 30.36 kg/m   Lungs:  CTA B Cardio: RRR without M/R/G Abd: Soft, gravid, NT Presentation: cephalic EXT: No C/C/ 1+ Edema DTRs: 2+ B   CERVIX: Dilation: 10 Dilation Complete Date: 05/23/19 Dilation Complete Time: 0530 Effacement (%): 100 Cervical Position: Middle Station: Plus 2 Presentation: Vertex Exam by:: K.Maldonado,RN Nurse exam wrong -  repeated by second nurse.  6cm / 90%   FHR:  Variability: Good {> 6 bpm)  Toco: Uterine Contractions: Q 3 min  Test Results  No results found for this or any previous visit (from the past 24 hour(s)).  Assessment  G3P2001 at 107w1d Estimated Date of Delivery: 05/29/19  The fetus is reassuring.   Patient Active Problem List   Diagnosis Date Noted  . Normal labor 05/23/2019  . Medication side effect 06/30/2015  . Chronic diarrhea of unknown origin   . Nausea with vomiting   . Gastritis   . D (diarrhea) 10/25/2014  . Abdominal pain, epigastric 10/25/2014  . Calculus of kidney 10/25/2014  . Renal colic 25/36/6440  . Current smoker 04/12/2013  . Chronic cough 01/01/2012  . 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 Depression, major, recurrent ([place]) 10/02/2011  . 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 Abuse, drug or alcohol ([place]) 11/07/2010    Plan  1. Admit to L&D :   2. EFM: -- Category 1 3. Stadol or Epidural if desired.   4. Admission labs   Finis Bud, M.D. 05/23/2019 5:52 AM

## 2019-05-23 NOTE — Anesthesia Preprocedure Evaluation (Signed)
Anesthesia Evaluation  Patient identified by MRN, date of birth, ID band Patient awake    Reviewed: Allergy & Precautions, H&P , NPO status , Patient's Chart, lab work & pertinent test results, reviewed documented beta blocker date and time , Unable to perform ROS - Chart review only  History of Anesthesia Complications (+) PROLONGED EMERGENCE and history of anesthetic complications  Airway Mallampati: II  TM Distance: >3 FB Neck ROM: full    Dental  (+) Dental Advidsory Given   Pulmonary asthma , Current Smoker and Patient abstained from smoking.,    Pulmonary exam normal        Cardiovascular Exercise Tolerance: Good negative cardio ROS Normal cardiovascular exam     Neuro/Psych negative neurological ROS     GI/Hepatic Neg liver ROS, GERD  Controlled,  Endo/Other  Hypothyroidism   Renal/GU Renal disease  negative genitourinary   Musculoskeletal   Abdominal   Peds  Hematology negative hematology ROS (+)   Anesthesia Other Findings History of oral drug abuse and marijuana use  Reproductive/Obstetrics (+) Pregnancy                             Anesthesia Physical Anesthesia Plan  ASA: III  Anesthesia Plan: Epidural   Post-op Pain Management:    Induction:   PONV Risk Score and Plan:   Airway Management Planned:   Additional Equipment:   Intra-op Plan:   Post-operative Plan:   Informed Consent: I have reviewed the patients History and Physical, chart, labs and discussed the procedure including the risks, benefits and alternatives for the proposed anesthesia with the patient or authorized representative who has indicated his/her understanding and acceptance.       Plan Discussed with: Anesthesiologist  Anesthesia Plan Comments:         Anesthesia Quick Evaluation

## 2019-05-23 NOTE — Progress Notes (Signed)
Primary RN was in the room with the patient when she called out for assistance to call MD to attend delivery. I walked in the room and the baby was crowning. I put on sterile gloves and assisted the baby out. Meconium was present upon delivery. No nuchal Apgars were 9,9. Dr Valentino Saxon was present for Placenta delivery.

## 2019-05-23 NOTE — Lactation Note (Signed)
This note was copied from a baby's chart. Lactation Consultation Note  Patient Name: Kelsey Serrano CHYIF'O Date: 05/23/2019 Reason for consult: Follow-up assessment;Mother's request;Term;Other (Comment)(Blood glucose was 39 & she is sleepy)  Baby is sleepy.  Blood glucose was 39.  Mom can easily hand express copious amounts of colostrum.  Put her to the breast.  She sucked vigorously for about 5 minutes before coming off the breast asleep.  Gently aroused her and got her back on the breast.  She would take a few strong sucks and then fall asleep.  Demonstrated how to massage breast and gently tickle her chin to keep her actively sucking for another 5 minutes.  Mom still had lots of colostrum.  She hand expressed into a spoon for a couple of minutes and LC gave 2 teaspoons full of colostrum which she retained.  Mom tested (+) for amphetamines and marijuana on admission.  Mom denies taking any amphetamines recently, but admits to smoking marijuana.  She swears the only drug that she took was melatonin that she gives occasionally to her kids for sleep and that was way over a week ago.  Discussed risks and discouraged taking OTC meds and marijuana use while breast feeding.  This is mom's 3rd baby.  She breast fed her 69 year old for 4 months until she had to have kidney surgery.  She breast fed her 24 year old for 4 to 6 months.  Explained early feeding cues and encouraged mom to put her to the breast whenever she demonstrates hunger cues.  Reviewed normal newborn stomach size, normal course of lactation and routine newborn feeding patterns.  Lactation community resource hand out with contact numbers given and reviewed.  Lactation name and number written on white board and encouraged to call with any questions, concerns and assistance.     Maternal Data Formula Feeding for Exclusion: No Has patient been taught Hand Expression?: Yes Does the patient have breastfeeding experience prior to this delivery?:  Yes  Feeding Feeding Type: Breast Fed  LATCH Score Latch: Repeated attempts needed to sustain latch, nipple held in mouth throughout feeding, stimulation needed to elicit sucking reflex.  Audible Swallowing: A few with stimulation  Type of Nipple: Everted at rest and after stimulation  Comfort (Breast/Nipple): Soft / non-tender  Hold (Positioning): Assistance needed to correctly position infant at breast and maintain latch.  LATCH Score: 7  Interventions Interventions: Breast feeding basics reviewed;Assisted with latch;Skin to skin;Breast massage;Hand express;Breast compression;Adjust position;Support pillows;Position options;Expressed milk  Lactation Tools Discussed/Used WIC Program: Yes   Consult Status Consult Status: Follow-up Date: 05/23/19 Follow-up type: Call as needed    Louis Meckel 05/23/2019, 2:41 PM

## 2019-05-23 NOTE — Lactation Note (Signed)
This note was copied from a baby's chart. Lactation Consultation Note  Patient Name: [name] JKDTO'I Date: 05/23/2019 Reason for consult: Follow-up assessment;Mother's request;Term;Other (Comment)(Hypoglycemia resolved)  Mom requested Symphony be set up in room in case blood glucose dropped again tonight.  Symphony set up in room with instructions in hand expressing first preferably, pumping, collection, storage, cleaning, labeling and handling of expressed milk.  Transitional bowel movement changed.  She started rooting. Mom immediately put her to the breast without assistance and she began strong, rhythmic sucking with frequent swallows.  Mom denies any breast or nipple pain.   Maternal Data Formula Feeding for Exclusion: No Has patient been taught Hand Expression?: Yes Does the patient have breastfeeding experience prior to this delivery?: Yes  Feeding Feeding Type: Breast Fed  LATCH Score Latch: Grasps breast easily, tongue down, lips flanged, rhythmical sucking.  Audible Swallowing: Spontaneous and intermittent  Type of Nipple: Everted at rest and after stimulation  Comfort (Breast/Nipple): Soft / non-tender  Hold (Positioning): No assistance needed to correctly position infant at breast.  LATCH Score: 10  Interventions Interventions: Breast massage;Hand express;Breast compression;Support pillows;Coconut oil  Lactation Tools Discussed/Used WIC Program: Yes Pump Review: Setup, frequency, and cleaning;Milk Storage;Other (comment) Initiated by:: S.Kleber Crean,RN,BSN,IBCLC Date initiated:: 05/23/19   Consult Status Consult Status: PRN Follow-up type: Call as needed    Louis Meckel 05/23/2019, 9:05 PM

## 2019-05-23 NOTE — Progress Notes (Signed)
Pt presents to the ED c/o contractions. Pt is a 24 y/o G3P2002 at [redacted]w[redacted]d. Pt states "I lost my mucus plug a few days ago and have been contracting on and off but within the past 24 hours they have gotten regular every 3 to 5 minutes." Pt states the only complication she had during this pregnancy is a low- lying placenta at 9 weeks. No repeat ultrasound found in pt's chart. Pt has received limited prenatal care, pt started care in [place] and was planning on transferring her care to [facility]. Pt came to [facility] because it is closer to her home. Pt reports ctx pain 10/10 and describes it as shooting pain down to her vaginal with the urge to push during a contraction. Pt denies vaginal bleeding or leaking of fluids and states positive fetal movement. External monitors applied and assessing. Initial FHT 125. VSS.

## 2019-05-23 NOTE — Anesthesia Procedure Notes (Signed)
Epidural Patient location during procedure: OB Start time: 05/23/2019 7:12 AM End time: 05/23/2019 7:28 AM  Staffing Anesthesiologist: Karleen Hampshire, MD Resident/CRNA: Ginger Carne, CRNA Performed: resident/CRNA   Preanesthetic Checklist Completed: patient identified, IV checked, site marked, risks and benefits discussed, surgical consent, monitors and equipment checked, pre-op evaluation and timeout performed  Epidural Patient position: sitting Prep: ChloraPrep Patient monitoring: heart rate, continuous pulse ox and blood pressure Approach: midline Location: L3-L4 Injection technique: LOR saline  Needle:  Needle type: Tuohy  Needle gauge: 17 G Needle length: 9 cm and 9 Needle insertion depth: 7 cm Catheter type: closed end flexible Catheter size: 19 Gauge Catheter at skin depth: 12 cm Test dose: negative and 1.5% lidocaine with Epi 1:200 K  Assessment Sensory level: T10 Events: blood not aspirated, injection not painful, no injection resistance, no paresthesia and negative IV test  Additional Notes 1 attempt Pt. Evaluated and documentation done after procedure finished. Patient identified. Risks/Benefits/Options discussed with patient including but not limited to bleeding, infection, nerve damage, paralysis, failed block, incomplete pain control, headache, blood pressure changes, nausea, vomiting, reactions to medication both or allergic, itching and postpartum back pain. Confirmed with bedside nurse the patient's most recent platelet count. Confirmed with patient that they are not currently taking any anticoagulation, have any bleeding history or any family history of bleeding disorders. Patient expressed understanding and wished to proceed. All questions were answered. Sterile technique was used throughout the entire procedure. Please see nursing notes for vital signs. Test dose was given through epidural catheter and negative prior to continuing to dose epidural  or start infusion. Warning signs of high block given to the patient including shortness of breath, tingling/numbness in hands, complete motor block, or any concerning symptoms with instructions to call for help. Patient was given instructions on fall risk and not to get out of bed. All questions and concerns addressed with instructions to call with any issues or inadequate analgesia.   Patient tolerated the insertion well without immediate complications.Reason for block:procedure for pain

## 2019-05-24 ENCOUNTER — Encounter: Payer: Self-pay | Admitting: Anesthesiology

## 2019-05-24 DIAGNOSIS — O99333 Smoking (tobacco) complicating pregnancy, third trimester: Secondary | ICD-10-CM

## 2019-05-24 DIAGNOSIS — F1510 Other stimulant abuse, uncomplicated: Secondary | ICD-10-CM

## 2019-05-24 DIAGNOSIS — Z3A39 39 weeks gestation of pregnancy: Secondary | ICD-10-CM

## 2019-05-24 DIAGNOSIS — O99324 Drug use complicating childbirth: Secondary | ICD-10-CM

## 2019-05-24 DIAGNOSIS — F1210 Cannabis abuse, uncomplicated: Secondary | ICD-10-CM

## 2019-05-24 DIAGNOSIS — O99323 Drug use complicating pregnancy, third trimester: Secondary | ICD-10-CM

## 2019-05-24 LAB — CBC
HCT: 37.2 % (ref 36.0–46.0)
Hemoglobin: 12.5 g/dL (ref 12.0–15.0)
MCH: 29.9 pg (ref 26.0–34.0)
MCHC: 33.6 g/dL (ref 30.0–36.0)
MCV: 89 fL (ref 80.0–100.0)
Platelets: 140 10*3/uL — ABNORMAL LOW (ref 150–400)
RBC: 4.18 MIL/uL (ref 3.87–5.11)
RDW: 12.7 % (ref 11.5–15.5)
WBC: 12.3 10*3/uL — ABNORMAL HIGH (ref 4.0–10.5)
nRBC: 0 % (ref 0.0–0.2)

## 2019-05-24 LAB — VARICELLA ZOSTER ANTIBODY, IGG: Varicella IgG: 3084 index (ref >165)

## 2019-05-24 LAB — RUBELLA SCREEN: Rubella: 1.66 index (ref >0.99)

## 2019-05-24 MED ORDER — NICOTINE 7 MG/24HR TD PT24
7.0000 mg | MEDICATED_PATCH | Freq: Every day | TRANSDERMAL | Status: DC
  Administered 2019-05-24: 7 mg via TRANSDERMAL
  Filled 2019-05-24 (×2): qty 1

## 2019-05-24 NOTE — Progress Notes (Signed)
Post Partum Day # 1, s/p SVD  Subjective: no complaints, up ad lib, voiding and tolerating PO.   Objective: Temp:  [97.9 F (36.6 C)-98.4 F (36.9 C)] 98.2 F (36.8 C) (03/22 2316) Pulse Rate:  [57-85] 83 (03/22 1934) Resp:  [18-20] 18 (03/22 2316) BP: (110-151)/(70-94) 121/81 (03/22 2316) SpO2:  [99 %-100 %] 100 % (03/22 2316)  Physical Exam:  General: alert and no distress  Lungs: clear to auscultation bilaterally Breasts: normal appearance, no masses or tenderness Heart: regular rate and rhythm, S1, S2 normal, no murmur, click, rub or gallop Abdomen: soft, non-tender; bowel sounds normal; no masses,  no organomegaly Pelvis: Lochia: appropriate, Uterine Fundus: firm Extremities: DVT Evaluation: No evidence of DVT seen on physical exam. Negative Homan's sign. No cords or calf tenderness. No significant calf/ankle edema.  Recent Labs    05/23/19 0606 05/24/19 0653  HGB 13.7 12.5  HCT 39.6 37.2    Results for orders placed or performed during the hospital encounter of 05/23/19  Urine Drug Screen, Qualitative ([facility] only)  Result Value Ref Range   Tricyclic, Ur Screen NONE DETECTED NONE DETECTED   Amphetamines, Ur Screen POSITIVE (A) NONE DETECTED   MDMA (Ecstasy)Ur Screen NONE DETECTED NONE DETECTED   Cocaine Metabolite,Ur [place] NONE DETECTED NONE DETECTED   Opiate, Ur Screen NONE DETECTED NONE DETECTED   Phencyclidine (PCP) Ur S NONE DETECTED NONE DETECTED   Cannabinoid 50 Ng, Ur  POSITIVE (A) NONE DETECTED   Barbiturates, Ur Screen NONE DETECTED NONE DETECTED   Benzodiazepine, Ur Scrn NONE DETECTED NONE DETECTED   Methadone Scn, Ur NONE DETECTED NONE DETECTED     Assessment/Plan: Overall doing well Breastfeeding, Lactation consult  Social Work consult for substance use in pregnancy (marijuana/tobacco/amphetamines) and no prenatal care Will order nicotine patch as patient desires to ambulate to smoke. Had decreased to 4 cig/day in pregnancy.  Desires [facility] for  contraception (considering IUD).  Will arrange for f/u outpatient.  Will d/c home likely tomorrow.    LOS: 1 day   Hildred Laser, MD [facility] 05/24/2019 8:09 AM

## 2019-05-24 NOTE — Lactation Note (Signed)
This note was copied from a baby's chart. Lactation Consultation Note  Patient Name: Girl Caria Transue LEZVG'J Date: 05/24/2019   [facility] Student entered room and child was in bassinet. FOB handed [name] the baby and [name] latched baby in cross cradle on left breast. [name] requested a history of feeding her two previous children for 4-6 months and wants to go longer with current baby "because it is free". [name] reported that she has "too much" milk and baby has been falling asleep. Feeding lasted about 5 minutes and then baby feel asleep.  [facility] student educated on keeping baby alert and feeding cues. [name] reported that baby has a "very small mouth" and LC student provided education re: flanged lips.  Baby is unnamed.   [name] reported all questions answered at this time, knows to call lactation, as needed.  Maternal Data    Feeding Feeding Type: Breast Fed  LATCH Score                   Interventions    Lactation Tools Discussed/Used     Consult Status      Carlena Hurl 05/24/2019, 12:24 PM

## 2019-05-24 NOTE — Progress Notes (Addendum)
CSW called [place] DSS to make CPS report as required due to Baby's drug screen being positive. Left a message requesting a return call from Intake Worker.   1:30- Return call from Kenneth. Report made. Requested a call with any updates or if there are any barriers to Baby's discharge. CSW informed Kenneth that per RN, [name] and Baby will likely be discharged tomorrow morning.  4:17- CSW called [place] CPS to follow-up on report. No answer. Left a voicemail requesting a call if there are any updates or barriers to [name] and Baby discharging tomorrow as planned.  5:15- Call from CPS Worker Jeanelle Collins ([phone]). No barriers to discharge per Jeanelle.   Kelsey Fogal, LCSW [phone]  

## 2019-05-24 NOTE — Lactation Note (Signed)
This note was copied from a baby's chart. Lactation Consultation Note  Patient Name: [name] Today's Date: 05/24/2019   [facility] student entered room and [name] holding baby in side lying position on bed.   [facility] student provided education re: cluster feeding.   [name] reported all questions answered at this time.   [name] knows to call out for Lactation, PRN.   Maternal Data    Feeding    LATCH Score                   Interventions    Lactation Tools Discussed/Used     Consult Status      Carlena Hurl 05/24/2019, 4:09 PM

## 2019-05-24 NOTE — Progress Notes (Signed)
Nutrition Brief Note  [facility] that identifies antenatal nutrition risk  Wt Readings from Last 15 Encounters:  05/23/19 75.3 kg  04/28/18 58.1 kg  08/26/17 61.2 kg  03/31/16 63.5 kg  03/24/16 63.5 kg  01/18/16 59 kg  06/30/15 64 kg  01/09/15 56.7 kg (44 %, Z= -0.15)*  12/06/14 56.7 kg (44 %, Z= -0.14)*  12/04/14 56.7 kg (44 %, Z= -0.14)*  11/30/14 59 kg (54 %, Z= 0.10)*  09/08/14 60.3 kg (60 %, Z= 0.25)*  08/05/14 62.6 kg (68 %, Z= 0.46)*  07/06/14 65.8 kg (77 %, Z= 0.72)*   * Growth percentiles are based on CDC (Girls, 2-20 Years) data.    24 year old female now postpartum day #1 s/p SVD.  Spoke with patient over the phone. She reports her appetite is good and she is eating well at meals. She reports she is eating 100% of her meals here. She does endorse decreased appetite and intake prior to delivery. However, she reports she did not have any weight loss during pregnancy. Patient does not meet criteria for malnutrition at this time.  Current diet order is regular, patient is consuming approximately 100% of meals at this time. Labs and medications reviewed.   No nutrition interventions warranted at this time. If nutrition issues arise, please consult RD.   Felix Pacini, MS, RD, LDN Pager number available on Amion

## 2019-05-24 NOTE — Anesthesia Postprocedure Evaluation (Signed)
Anesthesia Post Note  Patient: Kelsey Serrano  Procedure(s) Performed: AN AD HOC EPIDURAL  Patient location during evaluation: Mother Baby Anesthesia Type: Epidural Level of consciousness: awake, lethargic and oriented Pain management: pain level controlled Vital Signs Assessment: post-procedure vital signs reviewed and stable Respiratory status: spontaneous breathing Cardiovascular status: stable Postop Assessment: no headache Anesthetic complications: no     Last Vitals:  Vitals:   05/23/19 1934 05/23/19 2316  BP: 110/74 121/81  Pulse: 83   Resp: 18 18  Temp: 36.8 C 36.8 C  SpO2: 99% 100%    Last Pain:  Vitals:   05/23/19 2316  TempSrc: Oral  PainSc:                  Vernie Murders

## 2019-05-24 NOTE — Clinical Social Work Maternal (Signed)
  CLINICAL SOCIAL WORK MATERNAL/CHILD NOTE  Patient Details  Name: Kelsey Serrano MRN: [id number] Date of Birth: 1995-04-03  Date:  05/24/2019  Clinical Social Worker Initiating Note:  Oleh Genin, LCSW Date/Time: Initiated:  05/24/19/      Child's Name:  Undecided on Baby's name, last name Leafy Ro   Biological Parents:  Mother, Father   Need for Interpreter:  None   Reason for Referral:  Late or No Prenatal Care    Address:  [place] [address]  - Address is [address] [place], [place], [place] per [name]   Phone number:  [phone] (home)     Additional phone number:  [phone] - preferred number per [name]  Household Members/Support Persons (HM/SP):   Household Member/Support Person 1, Household Member/Support Person 2, Household Member/Support Person 3   HM/SP Name Relationship DOB or Age  HM/SP -[place] FOB unknown  HM/SP -[place] child 3  HM/SP -[place] child 5  HM/SP -4        HM/SP -5        HM/SP -6        HM/SP -7        HM/SP -8          Natural Supports (not living in the home):  Immediate Family   Professional Supports:     Employment: Unemployed   Type of Work:     Education:  [place] arranged:    Museum/gallery curator Resources:  Medicaid   Other Resources:  Physicist, medical    Cultural/Religious Considerations Which May Impact Care:  None reported.  Strengths:  Home prepared for child , Ability to meet basic needs    Psychotropic Medications:         Pediatrician:       Pediatrician List:   [facility]      Pediatrician Fax Number:    Risk Factors/Current Problems:  Substance Use    Cognitive State:  Alert , Able to Concentrate    Mood/Affect:  Calm    CSW Assessment: CSW consulted for limited [facility] and drug exposed newborn. CSW met with [name] at bedside. Explained CSW role and  reason for referral. Explained Drug Screen Policy and that CPS will be called if Baby's drug screen is positive. [name] verbalized understanding.  [name] reported her main support is FOB. She reported she receives Physicist, medical and was encouraged to Tourist information centre manager of 57 birth.   [name] denied mental health history or current issues. Per chart, [name] did have depression in the past. CSW provided education and information sheets on PPD and SIDS.   [name] reported she is working on choosing a Lexicographer. [name] reported she has a new car seat, bassinet, and other items needed for Baby. [name] reported she provides transportation.  CSW will make CPS report due to positive drug screen on Baby.  CSW Plan/Description:  CSW Will Continue to Monitor Umbilical Cord Tissue Drug Screen Results and Make Report if Warranted, Child Protective Service Report , CSW Awaiting [place], LCSW 05/24/2019, 12:57 PM

## 2019-05-24 NOTE — Discharge Instructions (Signed)

## 2019-05-25 DIAGNOSIS — O99333 Smoking (tobacco) complicating pregnancy, third trimester: Secondary | ICD-10-CM

## 2019-05-25 DIAGNOSIS — O99323 Drug use complicating pregnancy, third trimester: Secondary | ICD-10-CM

## 2019-05-25 DIAGNOSIS — O99324 Drug use complicating childbirth: Secondary | ICD-10-CM

## 2019-05-25 DIAGNOSIS — F1210 Cannabis abuse, uncomplicated: Secondary | ICD-10-CM

## 2019-05-25 DIAGNOSIS — Z3A39 39 weeks gestation of pregnancy: Secondary | ICD-10-CM

## 2019-05-25 DIAGNOSIS — F1510 Other stimulant abuse, uncomplicated: Secondary | ICD-10-CM

## 2019-05-25 MED ORDER — IBUPROFEN 800 MG PO TABS: 800.0000 mg | ORAL_TABLET | Freq: Three times a day (TID) | ORAL | 0 refills | Status: DC | PRN

## 2019-05-25 NOTE — Discharge Summary (Signed)
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 yo [id number] 355w1das admitted in Active Labor on 05/23/2019. Patient had an uncomplicated labor course as follows:  Membrane Rupture Time/Date: 8:26 AM ,05/23/2019   Intrapartum Procedures: Episiotomy: None [1]                                         Lacerations:  None [1]  Patient had a delivery of a Viable infant. 05/23/2019  Information for the patient's newborn:  SnYuette, Putnam0[[id number]]Delivery Method: Vag-Spont     Pateint had an uncomplicated postpartum course.  EBL 230 ml.  She is ambulating, tolerating a regular diet, passing flatus, and urinating well. Patient is discharged home in stable condition on 05/25/19.  Delivery Summary for Kelsey ZIRKELBACHDelivery 05/23/2019 8:28 AM by  Vaginal, Spontaneous Sex:  female Gestational Age: 1276w1dlivery Clinician:   Living?:         APGARS  One minute Five minutes Ten minutes  Skin color:        Heart rate:        Grimace:        Muscle tone:        Breathing:        Totals: 9  9      Presentation/position:      Resuscitation:   Cord information:    Disposition of cord blood:     Blood gases sent?  Complications:   Placenta: Delivered:       appearance Newborn Measurements: Weight: 5 lb 14.9 oz (2690 g)  Height: 19.69"  Head  circumference:    Chest circumference:    Other providers:    Additional  information: Forceps:   Vacuum:   Breech:   Observed anomalies      Magnesium Sulfate received: No BMZ received: No Rhophylac:No MMR:N/A (Immune) Transfusion:No  Physical exam  Vitals:   05/24/19 0857 05/24/19 1612 05/24/19 1900 05/25/19 0420  BP: 117/77 120/73 129/84 107/68  Pulse: 69 77 (!) 108 77  Resp: _0 Temp: 98.2 F (36.8 C) 97.9 F (36.6 C) 98.4 F (36.9 C) 98.5 F (36.9 C)  TempSrc: Oral Oral Oral Oral  SpO2: 99% 98% 96% 98%  Weight:      Height:  General: alert and no distress Lochia: appropriate Uterine Fundus: firm Incision: N/A DVT Evaluation: No evidence of DVT seen on physical exam. Negative Homan's sign. No cords or calf tenderness. No significant calf/ankle edema.   Labs: Lab Results  Component Value Date   WBC 12.3 (H) 05/24/2019   HGB 12.5 05/24/2019   HCT 37.2 05/24/2019   MCV 89.0 05/24/2019   PLT 140 (L) 05/24/2019    CMP Latest Ref Rng & Units 01/18/2016  Glucose 65 - 99 mg/dL 97  BUN 6 - 20 mg/dL 12  Creatinine 0.44 - 1.00 mg/dL 0.70  Sodium 135 - 145 mmol/L 137  Potassium 3.5 - 5.1 mmol/L 3.6  Chloride 101 - 111 mmol/L 109  CO2 22 - 32 mmol/L 22  Calcium 8.9 - 10.3 mg/dL 8.8(L)  Total Protein 6.5 - 8.1 g/dL 7.6  Total Bilirubin 0.3 - 1.2 mg/dL 0.9  Alkaline Phos 38 - 126 U/L 54  AST 15 - 41 U/L 17  ALT 14 - 54 U/L 11(L)   Edinburgh Score: Edinburgh Postnatal Depression Scale Screening Tool 05/25/2019  I have been able to laugh and see the funny side of things. 0  I have looked forward with enjoyment to things. 0  I have blamed myself unnecessarily when things went wrong. 1  I have been anxious or worried for no good reason. 0  I have felt scared or panicky for no good reason. 0  Things have been getting on top of me. 1  I have been so unhappy that I have had difficulty sleeping. 0  I have felt sad or miserable. 0  I have been so  unhappy that I have been crying. 1  The thought of harming myself has occurred to me. 0  Edinburgh Postnatal Depression Scale Total 3    Discharge instruction: per After Visit Summary and "Baby and Me Booklet".  After visit meds:  Allergies as of 05/25/2019      Reactions   Augmentin [amoxicillin-pot Clavulanate] Nausea And Vomiting   Estrogens Nausea And Vomiting   Pt states she is allergic to high doses of estrogens.   Lavender Oil Hives   Zyrtec [cetirizine] Nausea And Vomiting      Medication List    STOP taking these medications   amoxicillin 500 MG capsule Commonly known as: AMOXIL     TAKE these medications   ibuprofen 800 MG tablet Commonly known as: ADVIL Take 1 tablet (800 mg total) by mouth every 8 (eight) hours as needed.   multivitamin-prenatal 27-0.8 MG Tabs tablet Take 1 tablet by mouth daily at 12 noon.       Diet: routine diet  Activity: Advance as tolerated. Pelvic rest for 6 weeks.   Outpatient follow up:6 weeks Follow up Appt:No future appointments.    Please schedule this patient for Postpartum visit in: 6 weeks with the following provider: MD In-Person For C/S patients schedule nurse incision check in weeks 2 weeks: N/A Anticipated Birth Control:  IUD PP Procedures needed: None  Schedule Integrated BH visit: no     Newborn Data: Live born female  Birth Weight: 5 lb 14.9 oz (2690 g) APGAR: 9, 9  Newborn Delivery   Birth date/time: 05/23/2019 08:28:00 Delivery type: Vaginal, Spontaneous      Baby Feeding: Breast Disposition:home with mother   05/25/2019 Rubie Maid, MD

## 2019-05-25 NOTE — Anesthesia Postprocedure Evaluation (Signed)
Anesthesia Post Note  Patient: Kelsey Serrano  Procedure(s) Performed: AN AD HOC LABOR EPIDURAL  Patient location during evaluation: Mother Baby Anesthesia Type: Epidural Level of consciousness: awake and alert Pain management: pain level controlled Vital Signs Assessment: post-procedure vital signs reviewed and stable Respiratory status: spontaneous breathing, nonlabored ventilation and respiratory function stable Cardiovascular status: stable Postop Assessment: no headache, no backache and epidural receding Anesthetic complications: no     Last Vitals:  Vitals:   05/25/19 0420 05/25/19 0755  BP: 107/68 124/70  Pulse: 77 85  Resp: 20 20  Temp: 36.9 C 36.9 C  SpO2: 98% 97%    Last Pain:  Vitals:   05/25/19 0755  TempSrc: Oral  PainSc:                  Rica Mast

## 2019-05-25 NOTE — Progress Notes (Signed)
Patient discharged home with infant. Discharge instructions and prescriptions given and reviewed with patient. Patient verbalized understanding. Escorted out by auxillary.  

## 2019-07-27 ENCOUNTER — Institutional Professional Consult (permissible substitution): Payer: Medicaid Other | Admitting: Pulmonary Disease

## 2019-08-04 ENCOUNTER — Encounter: Payer: Self-pay | Admitting: Pulmonary Disease

## 2019-08-04 ENCOUNTER — Other Ambulatory Visit: Payer: Self-pay

## 2019-08-04 ENCOUNTER — Ambulatory Visit (INDEPENDENT_AMBULATORY_CARE_PROVIDER_SITE_OTHER): Payer: Medicaid Other | Admitting: Pulmonary Disease

## 2019-08-04 VITALS — BP 100/80 | HR 70 | Temp 97.1°F | Ht 62.0 in | Wt 157.4 lb

## 2019-08-04 DIAGNOSIS — J4520 Mild intermittent asthma, uncomplicated: Secondary | ICD-10-CM | POA: Diagnosis not present

## 2019-08-04 DIAGNOSIS — F17210 Nicotine dependence, cigarettes, uncomplicated: Secondary | ICD-10-CM | POA: Diagnosis not present

## 2019-08-04 DIAGNOSIS — R918 Other nonspecific abnormal finding of lung field: Secondary | ICD-10-CM

## 2019-08-04 NOTE — Progress Notes (Deleted)
   Subjective:    Patient ID: Kelsey Serrano, female    DOB: 01/19/1996, 24 y.o.   MRN: [id number]  HPI    Review of Systems     Objective:   Physical Exam        Assessment & Plan:   

## 2019-08-04 NOTE — Progress Notes (Signed)
2

## 2019-08-04 NOTE — Progress Notes (Signed)
Subjective:    Patient ID: Kelsey Serrano, female    DOB: June 01, 1995, 24 y.o.   MRN: [id number]  HPI Patient is a 24 year old current smoker (1/4 PPD) who presents for evaluation of multiple lung nodules seen on CT scan in August 2020 in an outside facility.  She is kindly referred by Dr. Crissie Sickles.  The patient had an acute illness in August was admitted to [facility] in [place], [place].  Because of chest pain she underwent the CT angio that by report showed multiple confluent masses and nodules some of them cavitary.  She had a fever and was treated with triple antibiotics.  She had a PPD which was negative.  She had an echocardiogram that did not show any vegetations and cardiac function was normal.  Disclaimer was that the aortic valve could not be seen well.  Her urine drug screen was only positive for THC.  The patient improved with management of presumed pneumonia.  Cultures were negative.  She was evaluated by pulmonary medicine and it was recommended that she complete a 21-day course of antibiotics which she did.  He was supposed to have a follow-up CT scan patient however she did not have this done due to the fact that she was pregnant at that time.  Patient since has decided to relocate to the [place] area and we are evaluating her today for follow-up on these issues.  She has been asymptomatic since management for her pneumonia at that time.  She delivered a healthy female baby in March of this year, she has not had any issues with dyspnea, paroxysmal nocturnal dyspnea or orthopnea.  No fevers, chills or sweats.  She does have a history of asthma as a child but usually well controlled with just as needed albuterol and avoidance of allergens such as lavender.  She is currently a smoker but is working on discontinuation of smoking.  She voices no other complaints she feels well and looks well.   I have reviewed the available discharge summary from  [facility], available through "care everywhere".  Review of Systems A 10 point review of systems was performed and it is as noted above otherwise negative.  Past Medical History:  Diagnosis Date  . Abuse, drug or alcohol (HCC) 11/07/2010   Overview:  A.  Never IVDA.   prescription drugs, THC, methamphetamines.   B.   Denies any since March 2013.  No methamphetamines 04/24/2009 Addendum [name]- NCCSR check is negative, reviewed with PCP do not think current problem   . 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  No prior ED visits or hospitalization   . Asthma   . Calculus of kidney 10/25/2014  . Chronic cough 01/01/2012   Overview:  A.  "smokers cough"   . Complication of anesthesia    BP and HR were low after lithotripsy([facility])  . D (diarrhea) 10/25/2014  . Depression, major, recurrent (HCC) 10/02/2011  . Hypothyroidism    when younger - resolved  . Impacted tooth    current - wisdon tooth  . 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  Depression, PTSA, mood disorder NOS B.  Psychiatrist:  Mathews Argyle C.  Therapist:  Elita Quick   . Renal colic 10/25/2014  . Renal disorder   . Restless leg syndrome    Past Surgical History:  Procedure Laterality Date  . COLONOSCOPY WITH PROPOFOL N/A 12/04/2014   Procedure: COLONOSCOPY WITH PROPOFOL;  Surgeon: Ebony Hail  Allen Norris, MD;  Location: [place];  Service: Endoscopy;  Laterality: N/A;  . DENTAL SURGERY    . ESOPHAGOGASTRODUODENOSCOPY (EGD) WITH PROPOFOL N/A 12/04/2014   Procedure: ESOPHAGOGASTRODUODENOSCOPY (EGD) WITH PROPOFOL;  Surgeon: Lucilla Lame, MD;  Location: [place];  Service: Endoscopy;  Laterality: N/A;  . kidney stent    . kidney stones Left   . LITHOTRIPSY     Family History  Problem Relation Age of Onset  . Diabetes Mother    Social History   Tobacco Use  . Smoking status: Current Every Day Smoker    Packs/day: 0.50    Years: 7.00    Pack years: 3.50    Types: Cigarettes  .  Smokeless tobacco: Never Used  Substance Use Topics  . Alcohol use: No   Positive for marijuana use.  No IV drug use.  No amphetamine use since 2011.  No alcohol.  Homemaker.  Allergies  Allergen Reactions  . Augmentin [Amoxicillin-Pot Clavulanate] Nausea And Vomiting  . Estrogens Nausea And Vomiting    Pt states she is allergic to high doses of estrogens.  Laurina Bustle Oil Hives  . Zyrtec [Cetirizine] Nausea And Vomiting   Current Meds  Medication Sig  . Prenatal Vit-Fe Fumarate-FA (MULTIVITAMIN-PRENATAL) 27-0.8 MG TABS tablet Take 1 tablet by mouth daily at 12 noon.   Immunization History  Administered Date(s) Administered  . HPV Quadrivalent 08/01/2011  . Influenza Split 11/02/2018  . Influenza, Seasonal, Injecte, Preservative Fre 01/19/2013  . Influenza,inj,Quad PF,6+ Mos 11/25/2013  . Influenza-Unspecified 01/19/2013, 11/25/2013  . PPD Test 10/30/2018  . Tdap 10/28/2013, 06/20/2015, 08/26/2017       Objective:   Physical Exam BP 100/80 (BP Location: Left Arm, Patient Position: Sitting, Cuff Size: Normal)   Pulse 70   Temp (!) 97.1 F (36.2 C) (Temporal)   Ht 5\' 2"  (1.575 m)   Wt 157 lb 6.4 oz (71.4 kg)   SpO2 98%   BMI 28.79 kg/m   GENERAL: Developed well-nourished no acute distress.  Fully ambulatory. HEAD: Normocephalic, atraumatic.  EYES: Pupils equal, round, reactive to light.  No scleral icterus.  MOUTH: Nose/mouth/throat not examined due to masking requirements for COVID 19. NECK: Supple. No thyromegaly. Trachea midline. No JVD.  No adenopathy. PULMONARY: Lungs clear to auscultation bilaterally.  Good air entry. CARDIOVASCULAR: S1 and S2. Regular rate and rhythm.  No rubs, murmurs gallops heard. GASTROINTESTINAL: Benign. MUSCULOSKELETAL: No joint deformity, no clubbing, no edema.  NEUROLOGIC: No overt focal deficit.  Awake and alert.  No gait disturbance noted.  Fully ambulatory. SKIN: Intact,warm,dry. PSYCH: Mood and behavior are normal.        Assessment & Plan:     ICD-10-CM   1. Multiple lung nodules  R91.8 CT CHEST WO CONTRAST   We will need to obtain images from [facility] Repeat chest CT, noncontrasted Follow-up 4 to 6 weeks   2. Mild intermittent asthma without complication  Y85.02    Appears to be well compensated Continue albuterol as needed and avoidance of triggers  3. Tobacco dependence due to cigarettes  F17.210    Patient counseled with regards to discontinuation of smoking   Discussion:  Patient appears to have had multifocal pneumonia in August 2020.  Unable to elaborate further as I do not have the films to render opinion on.  By the report it appears that some of the lesions were cavitary.  Given that she had negative PPD and did respond to antibiotics I suspect that this was either  due to staph or strep.  Will obtain images from [facility] will repeat chest CT to ensure clearance of this issue.  If she has no lesions noted.  She may follow here as needed.  We will see her in follow-up in 4 to 6 weeks time to discuss results from the CT.  Gailen Shelter, MD [place] [facility]   *This note was dictated using voice recognition software/Dragon.  Despite best efforts to proofread, errors can occur which can change the meaning.  Any change was purely unintentional.

## 2019-08-04 NOTE — Patient Instructions (Signed)
Going to repeat a CT scan of the chest  We will see him in follow-up in 4 to 6 weeks time call sooner should any new difficulties arise

## 2019-08-26 ENCOUNTER — Ambulatory Visit
Admission: RE | Admit: 2019-08-26 | Discharge: 2019-08-26 | Disposition: A | Discharge status: Home / Self Care | Payer: Self-pay | Source: Ambulatory Visit | Attending: Pulmonary Disease | Admitting: Pulmonary Disease

## 2019-08-26 ENCOUNTER — Other Ambulatory Visit: Payer: Self-pay | Admitting: Pulmonary Disease

## 2019-08-26 DIAGNOSIS — R918 Other nonspecific abnormal finding of lung field: Secondary | ICD-10-CM

## 2019-10-05 ENCOUNTER — Ambulatory Visit: Payer: Medicaid Other | Admitting: Pulmonary Disease

## 2019-10-05 ENCOUNTER — Other Ambulatory Visit: Payer: Self-pay

## 2019-10-05 ENCOUNTER — Ambulatory Visit
Admission: RE | Admit: 2019-10-05 | Discharge: 2019-10-05 | Disposition: A | Discharge status: Home / Self Care | Payer: Medicaid Other | Source: Ambulatory Visit | Attending: Pulmonary Disease | Admitting: Pulmonary Disease

## 2019-10-05 ENCOUNTER — Encounter: Payer: Self-pay | Admitting: Pulmonary Disease

## 2019-10-05 VITALS — BP 106/78 | HR 100 | Temp 98.2°F | Ht 62.0 in | Wt 153.4 lb

## 2019-10-05 DIAGNOSIS — R918 Other nonspecific abnormal finding of lung field: Secondary | ICD-10-CM | POA: Insufficient documentation

## 2019-10-05 DIAGNOSIS — R0602 Shortness of breath: Secondary | ICD-10-CM | POA: Diagnosis present

## 2019-10-05 DIAGNOSIS — J4520 Mild intermittent asthma, uncomplicated: Secondary | ICD-10-CM | POA: Insufficient documentation

## 2019-10-05 DIAGNOSIS — F17210 Nicotine dependence, cigarettes, uncomplicated: Secondary | ICD-10-CM

## 2019-10-05 MED ORDER — ADVAIR HFA 115-21 MCG/ACT IN AERO: 2.0000 | INHALATION_SPRAY | Freq: Two times a day (BID) | RESPIRATORY_TRACT | 12 refills | Status: DC

## 2019-10-05 MED ORDER — ALBUTEROL SULFATE HFA 108 (90 BASE) MCG/ACT IN AERS: 2.0000 | INHALATION_SPRAY | Freq: Four times a day (QID) | RESPIRATORY_TRACT | 3 refills | Status: DC | PRN

## 2019-10-05 NOTE — Progress Notes (Signed)
    Assessment & Plan:  1. Mild intermittent asthma without complication - DG Chest 2 View; Future  2. Multiple lung nodules - DG Chest 2 View; Future  3. Tobacco dependence due to cigarettes  4. Shortness of breath (Primary) - DG Chest 2 View; Future   Patient Instructions  We have sent a prescription for Advair  to your pharmacy.  Also sent a prescription for Ventolin .  We are going to get a chest x-ray today.  We will see you in follow-up in 4 to 6 weeks time with physician or nurse practitioner.  Please note: late entry documentation due to logistical difficulties during COVID-19 pandemic. This note is filed for information purposes only, and is not intended to be used for billing, nor does it represent the full scope/nature of the visit in question. Please see any associated scanned media linked to date of encounter for additional pertinent information.  Subjective:    HPI: Kelsey Serrano is a 24 y.o. female presenting to the pulmonology clinic on 10/05/2019 with report of: Follow-up (pt reports of sob with exertion, dry cough at times prod clear mucus and wheezing. )     Outpatient Encounter Medications as of 10/05/2019  Medication Sig   [DISCONTINUED] albuterol  (VENTOLIN  HFA) 108 (90 Base) MCG/ACT inhaler Inhale into the lungs.   [DISCONTINUED] albuterol  (VENTOLIN  HFA) 108 (90 Base) MCG/ACT inhaler Inhale 2 puffs into the lungs every 6 (six) hours as needed for wheezing or shortness of breath. (Patient not taking: Reported on 01/31/2022)   [DISCONTINUED] fluticasone-salmeterol (ADVAIR  HFA) 115-21 MCG/ACT inhaler Inhale 2 puffs into the lungs 2 (two) times daily.   [DISCONTINUED] Prenatal Vit-Fe Fumarate-FA (MULTIVITAMIN-PRENATAL) 27-0.8 MG TABS tablet Take 1 tablet by mouth daily at 12 noon.   No facility-administered encounter medications on file as of 10/05/2019.      Objective:   Vitals:   10/05/19 1139  BP: 106/78  Pulse: 100  Temp: 98.2 F (36.8 C)   Height: 5' 2 (1.575 m)  Weight: 153 lb 6.4 oz (69.6 kg)  SpO2: 99%  TempSrc: Temporal  BMI (Calculated): 28.05     Physical exam documentation is limited by delayed entry of information.

## 2019-10-05 NOTE — Patient Instructions (Addendum)
We have sent a prescription for Advair to your pharmacy.  Also sent a prescription for Ventolin.  We are going to get a chest x-ray today.  We will see you in follow-up in 4 to 6 weeks time with physician or nurse practitioner.

## 2019-10-20 ENCOUNTER — Ambulatory Visit
Admission: RE | Admit: 2019-10-20 | Discharge: 2019-10-20 | Disposition: A | Discharge status: Home / Self Care | Payer: Medicaid Other | Source: Ambulatory Visit | Attending: Pulmonary Disease | Admitting: Pulmonary Disease

## 2019-10-20 ENCOUNTER — Other Ambulatory Visit: Payer: Self-pay

## 2019-10-20 DIAGNOSIS — R918 Other nonspecific abnormal finding of lung field: Secondary | ICD-10-CM | POA: Diagnosis not present

## 2019-11-24 ENCOUNTER — Ambulatory Visit: Payer: Medicaid Other | Admitting: Adult Health

## 2020-03-03 NOTE — L&D Delivery Note (Signed)
Delivery Note  Kelsey Serrano is a [id number] at [redacted]w[redacted]d with an LMP of 02/20/20, inconsistent with [place] at [redacted]w[redacted]d.   First Stage: Labor onset: 0200 Analgesia /Anesthesia intrapartum: Epidural AROM at 1457 meconium   Second Stage: Complete dilation at 1457 Onset of pushing at 1457 FHR second stage category 2  Delivery of a viable baby girl on 11/29/2020  at 1502 by CNM/ Dr Feliberto Gottron present Delivery of fetal head in OA position with restitution to LOT. no nuchal cord;  Anterior then posterior shoulders delivered easily with gentle downward traction. Baby placed on mom's chest, and attended to by baby RN Cord double clamped after cessation of pulsation, cut by Grandmother  Cord blood sample collected: Collection of cord blood donation N/A Arterial cord blood sample yes  Third Stage: Oxytocin bolus started after delivery of infant for hemorrhage prophylaxis  Placenta delivered manually intact with 3 VC @ 1523 Placenta disposition: discarded Uterine tone firm / bleeding small  no laceration identified  Anesthesia for repair: N/A Repair N/A Est. Blood Loss (mL): TBD  Complications: none  Mom to postpartum.  Baby to Couplet care / Skin to Skin.  Newborn: Information for the patient's newborn:  Ishitha, Roper [[id number]]  Live born female  Birth Weight:   APGAR: 8, 9  Newborn Delivery   Birth date/time: 11/29/2020 15:02:00 Delivery type: Vaginal, Spontaneous        Feeding planned: Breast  ---------- Chari Manning, CNM Certified Nurse Midwife [place]  [facility]

## 2020-05-06 ENCOUNTER — Emergency Department
Admission: EM | Admit: 2020-05-06 | Discharge: 2020-05-06 | Disposition: A | Discharge status: Home / Self Care | Payer: Medicaid Other | Attending: Emergency Medicine | Admitting: Emergency Medicine

## 2020-05-06 ENCOUNTER — Other Ambulatory Visit: Payer: Self-pay

## 2020-05-06 DIAGNOSIS — K029 Dental caries, unspecified: Secondary | ICD-10-CM | POA: Diagnosis not present

## 2020-05-06 DIAGNOSIS — J45909 Unspecified asthma, uncomplicated: Secondary | ICD-10-CM | POA: Diagnosis not present

## 2020-05-06 DIAGNOSIS — F17210 Nicotine dependence, cigarettes, uncomplicated: Secondary | ICD-10-CM | POA: Insufficient documentation

## 2020-05-06 DIAGNOSIS — K0889 Other specified disorders of teeth and supporting structures: Secondary | ICD-10-CM | POA: Diagnosis present

## 2020-05-06 DIAGNOSIS — Z7951 Long term (current) use of inhaled steroids: Secondary | ICD-10-CM | POA: Diagnosis not present

## 2020-05-06 MED ORDER — IBUPROFEN 800 MG PO TABS
800.0000 mg | ORAL_TABLET | Freq: Once | ORAL | Status: AC
  Administered 2020-05-06: 800 mg via ORAL
  Filled 2020-05-06: qty 1

## 2020-05-06 MED ORDER — HYDROCODONE-ACETAMINOPHEN 5-325 MG PO TABS: 1.0000 | ORAL_TABLET | ORAL | 0 refills | Status: DC | PRN

## 2020-05-06 MED ORDER — IBUPROFEN 800 MG PO TABS: 800.0000 mg | ORAL_TABLET | Freq: Three times a day (TID) | ORAL | 0 refills | Status: DC | PRN

## 2020-05-06 MED ORDER — ONDANSETRON 4 MG PO TBDP
4.0000 mg | ORAL_TABLET | Freq: Once | ORAL | Status: AC
  Administered 2020-05-06: 4 mg via ORAL
  Filled 2020-05-06: qty 1

## 2020-05-06 MED ORDER — ONDANSETRON 4 MG PO TBDP: 4.0000 mg | ORAL_TABLET | Freq: Four times a day (QID) | ORAL | 0 refills | Status: DC | PRN

## 2020-05-06 MED ORDER — HYDROCODONE-ACETAMINOPHEN 5-325 MG PO TABS
2.0000 | ORAL_TABLET | Freq: Once | ORAL | Status: AC
  Administered 2020-05-06: 2 via ORAL
  Filled 2020-05-06: qty 2

## 2020-05-06 MED ORDER — PENICILLIN V POTASSIUM 250 MG PO TABS
500.0000 mg | ORAL_TABLET | Freq: Once | ORAL | Status: AC
  Administered 2020-05-06: 500 mg via ORAL
  Filled 2020-05-06: qty 2

## 2020-05-06 MED ORDER — PENICILLIN V POTASSIUM 500 MG PO TABS: 500.0000 mg | ORAL_TABLET | Freq: Four times a day (QID) | ORAL | 0 refills | Status: DC

## 2020-05-06 NOTE — ED Triage Notes (Signed)
Pt complains of right sided jaw pressure and pain extending from tooth to ear. Pt states started clindamycin two days ago for same without improvement in symptoms, slight swelling noted to jaw but pt appears in no acute distress.

## 2020-05-06 NOTE — ED Notes (Signed)
Pt reports jaw pain, 10/10 pain, difficulty chewing, reports 3 days of ABX, right ear appears red, lump palpated under right jaw

## 2020-05-06 NOTE — ED Provider Notes (Signed)
[facility] Emergency Department Provider Note  ____________________________________________   Event Date/Time   First MD Initiated Contact with Patient 05/06/20 2302     (approximate)  I have reviewed the triage vital signs and the nursing notes.   HISTORY  Chief Complaint Jaw Pain    HPI Kelsey Serrano is a 25 y.o. female who presents to the emergency department with complaints of right-sided facial swelling, dental pain for several days.  She was seen in urgent care and started on clindamycin and has had 3 doses.  States she feels her symptoms are worsening instead of improving.  No fevers, difficulty swallowing, speaking or breathing.  She does not have a Education officer, community.        Past Medical History:  Diagnosis Date  . Abuse, drug or alcohol (HCC) 11/07/2010   Overview:  A.  Never IVDA.   prescription drugs, THC, methamphetamines.   B.   Denies any since March 2013.  No methamphetamines 04/24/2009 Addendum [name]- NCCSR check is negative, reviewed with PCP do not think current problem   . 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  No prior ED visits or hospitalization   . Asthma   . Calculus of kidney 10/25/2014  . Chronic cough 01/01/2012   Overview:  A.  "smokers cough"   . Complication of anesthesia    BP and HR were low after lithotripsy([facility])  . D (diarrhea) 10/25/2014  . Depression, major, recurrent (HCC) 10/02/2011  . Hypothyroidism    when younger - resolved  . Impacted tooth    current - wisdon tooth  . 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  Depression, PTSA, mood disorder NOS B.  Psychiatrist:  Mathews Argyle C.  Therapist:  Elita Quick   . Renal colic 10/25/2014  . Renal disorder   . Restless leg syndrome     Patient Active Problem List   Diagnosis Date Noted  . Normal labor 05/23/2019  . Medication side effect 06/30/2015  . Chronic diarrhea of unknown origin   . Nausea with vomiting   . Gastritis   . D (diarrhea)  10/25/2014  . Abdominal pain, epigastric 10/25/2014  . Calculus of kidney 10/25/2014  . Renal colic 10/25/2014  . Current smoker 04/12/2013  . Chronic cough 01/01/2012  . 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 Depression, major, recurrent (HCC) 10/02/2011  . 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 Abuse, drug or alcohol (HCC) 11/07/2010    Past Surgical History:  Procedure Laterality Date  . COLONOSCOPY WITH PROPOFOL N/A 12/04/2014   Procedure: COLONOSCOPY WITH PROPOFOL;  Surgeon: Midge Minium, MD;  [facility];  Service: Endoscopy;  Laterality: N/A;  . DENTAL SURGERY    . ESOPHAGOGASTRODUODENOSCOPY (EGD) WITH PROPOFOL N/A 12/04/2014   Procedure: ESOPHAGOGASTRODUODENOSCOPY (EGD) WITH PROPOFOL;  Surgeon: Midge Minium, MD;  Location: [facility];  Service: Endoscopy;  Laterality: N/A;  . kidney stent    . kidney stones Left   . LITHOTRIPSY      Prior to Admission medications   Medication Sig Start Date End Date Taking? Authorizing Provider  HYDROcodone-acetaminophen (NORCO/VICODIN) 5-325 MG tablet Take 1 tablet by mouth every 4 (four) hours as needed. 05/06/20  Yes Philip Eckersley, Layla Maw, DO  ibuprofen (ADVIL) 800 MG tablet Take 1 tablet (800 mg total) by mouth every 8 (eight) hours as needed for mild pain. 05/06/20  Yes Kaysee Hergert N, DO  ondansetron (ZOFRAN ODT) 4 MG disintegrating tablet Take 1 tablet (4 mg total) by mouth  every 6 (six) hours as needed for nausea or vomiting. 05/06/20  Yes Symir Mah, Layla MawKristen N, DO  penicillin v potassium (VEETID) 500 MG tablet Take 1 tablet (500 mg total) by mouth 4 (four) times daily. 05/06/20  Yes Abdulraheem Pineo, Baxter HireKristen N, DO  albuterol (VENTOLIN HFA) 108 (90 Base) MCG/ACT inhaler Inhale 2 puffs into the lungs every 6 (six) hours as needed for wheezing or shortness of breath. 10/05/19   Salena SanerGonzalez, Carmen L, MD  fluticasone-salmeterol (ADVAIR HFA) 680 [id number] 1575115-21 MCG/ACT inhaler Inhale 2 puffs into the lungs 2 (two) times daily. 10/05/19   Salena SanerGonzalez, Carmen L, MD  Prenatal  Vit-Fe Fumarate-FA (MULTIVITAMIN-PRENATAL) 27-0.8 MG TABS tablet Take 1 tablet by mouth daily at 12 noon.    [provider]    Allergies Augmentin [amoxicillin-pot clavulanate], Estrogens, Lavender oil, and Zyrtec [cetirizine]  Family History  Problem Relation Age of Onset  . Diabetes Mother     Social History Social History   Tobacco Use  . Smoking status: Current Every Day Smoker    Packs/day: 1.00    Years: 7.00    Pack years: 7.00    Types: Cigarettes  . Smokeless tobacco: Never Used  . Tobacco comment: 4 cigarettes daily--10/05/2019  Vaping Use  . Vaping Use: Never used  Substance Use Topics  . Alcohol use: No  . Drug use: No    Review of Systems Constitutional: No fever. Eyes: No visual changes. ENT: No sore throat. Cardiovascular: Denies chest pain. Respiratory: Denies shortness of breath. Gastrointestinal: No nausea, vomiting, diarrhea. Genitourinary: Negative for dysuria. Musculoskeletal: Negative for back pain. Skin: Negative for rash. Neurological: Negative for focal weakness or numbness.  ____________________________________________   PHYSICAL EXAM:  VITAL SIGNS: ED Triage Vitals  Enc Vitals Group     BP 05/06/20 2107 129/86     Pulse Rate 05/06/20 2107 100     Resp 05/06/20 2106 16     Temp 05/06/20 2106 98.8 F (37.1 C)     Temp Source 05/06/20 2106 Oral     SpO2 05/06/20 2108 99 %     Weight 05/06/20 2106 139 lb (63 kg)     Height 05/06/20 2106 5\' 2"  (1.575 m)     Head Circumference --      Peak Flow --      Pain Score 05/06/20 2106 7     Pain Loc --      Pain Edu? --      Excl. in GC? --    CONSTITUTIONAL: Alert and oriented and responds appropriately to questions.  Appears uncomfortable. HEAD: Normocephalic EYES: Conjunctivae clear, pupils appear equal, EOM appear intact ENT: normal nose; moist mucous membranes, mild right-sided facial swelling noted.  No redness or warmth noted to the face.  No swelling underneath the  tongue.  No Ludwig's angina.  Normal phonation, no stridor or drooling, no trismus.  Multiple dental caries with pain noted to the left lower teeth without sign of drainable abscess.  No tonsillar hypertrophy or exudate.  No uvular deviation.  TMs are clear bilaterally without erythema, purulence, bulging, perforation, effusion.  No cerumen impaction or sign of foreign body in the external auditory canal. No inflammation, erythema or drainage from the external auditory canal. No signs of mastoiditis. No pain with manipulation of the pinna bilaterally. NECK: Supple, normal ROM, no meningismus, no significant cervical lymphadenopathy CARD: RRR; S1 and S2 appreciated; no murmurs, no clicks, no rubs, no gallops RESP: Normal chest excursion without splinting or tachypnea; breath sounds clear and equal bilaterally;  no wheezes, no rhonchi, no rales, no hypoxia or respiratory distress, speaking full sentences ABD/GI: Nondistended BACK: The back appears normal EXT: Normal ROM in all joints; no deformity noted SKIN: Normal color for age and race; warm; no rash on exposed skin NEURO: Moves all extremities equally PSYCH: The patient's mood and manner are appropriate.  ____________________________________________   LABS (all labs ordered are listed, but only abnormal results are displayed)  Labs Reviewed - No data to display ____________________________________________  EKG  none ____________________________________________  RADIOLOGY I, Maverik Foot, personally viewed and evaluated these images (plain radiographs) as part of my medical decision making, as well as reviewing the written report by the radiologist.  ED MD interpretation:  none  Official radiology report(s): No results found.  ____________________________________________   PROCEDURES  Procedure(s) performed (including Critical Care):  Procedures  ____________________________________________   INITIAL IMPRESSION /  ASSESSMENT AND PLAN / ED COURSE  As part of my medical decision making, I reviewed the following data within the electronic MEDICAL RECORD NUMBER Nursing notes reviewed and incorporated, Old chart reviewed, Notes from prior ED visits and [place] Controlled Substance Database         Patient here with dental caries causing dental pain and likely underlying abscess.  No sign of Ludwig's angina, airway involvement.  Patient has no trismus, drooling.  Afebrile hemodynamically stable here.  She feels that she is not having any relief with clindamycin.  She was requesting a new antibiotic.  Will switch to penicillin.  Will provide a short course of pain medication for pain control I recommended close follow-up with a dentist as an outpatient.  At this time, I do not feel there is any life-threatening condition present. I have reviewed, interpreted and discussed all results (EKG, imaging, lab, urine as appropriate) and exam findings with patient/family. I have reviewed nursing notes and appropriate previous records.  I feel the patient is safe to be discharged home without further emergent workup and can continue workup as an outpatient as needed. Discussed usual and customary return precautions. Patient/family verbalize understanding and are comfortable with this plan.  Outpatient follow-up has been provided as needed. All questions have been answered.  ____________________________________________   FINAL CLINICAL IMPRESSION(S) / ED DIAGNOSES  Final diagnoses:  Dental caries     ED Discharge Orders         Ordered    penicillin v potassium (VEETID) 500 MG tablet  4 times daily        05/06/20 2341    ondansetron (ZOFRAN ODT) 4 MG disintegrating tablet  Every 6 hours PRN        05/06/20 2341    HYDROcodone-acetaminophen (NORCO/VICODIN) 5-325 MG tablet  Every 4 hours PRN        05/06/20 2341    ibuprofen (ADVIL) 800 MG tablet  Every 8 hours PRN        05/06/20 2341          *Please note:  CAROLA VIRAMONTES was evaluated in Emergency Department on 05/07/2020 for the symptoms described in the history of present illness. She was evaluated in the context of the global COVID-19 pandemic, which necessitated consideration that the patient might be at risk for infection with the SARS-CoV-2 virus that causes COVID-19. Institutional protocols and algorithms that pertain to the evaluation of patients at risk for COVID-19 are in a state of rapid change based on information released by regulatory bodies including the CDC and federal and state organizations. These policies and algorithms were  followed during the patient's care in the ED.  Some ED evaluations and interventions may be delayed as a result of limited staffing during and the pandemic.*   Note:  This document was prepared using Dragon voice recognition software and may include unintentional dictation errors.   Ronique Simerly, Layla Maw, DO 05/07/20 0111

## 2020-05-06 NOTE — ED Notes (Signed)
FIRST NURSE NOTE: ambulatory to desk, RR even and unlabored

## 2020-05-07 NOTE — ED Notes (Signed)
No peripheral IV placed this visit.   Discharge instructions reviewed with patient. Questions fielded by this RN. Patient verbalizes understanding of instructions. Patient discharged home in stable condition per ward. No acute distress noted at time of discharge.   Left with mother

## 2020-11-27 ENCOUNTER — Other Ambulatory Visit: Payer: Self-pay | Admitting: Obstetrics

## 2020-11-27 DIAGNOSIS — Z3490 Encounter for supervision of normal pregnancy, unspecified, unspecified trimester: Secondary | ICD-10-CM

## 2020-11-27 NOTE — Progress Notes (Signed)
[id number] at [redacted]w[redacted]d, LMP of 02/20/20, c/w early [place] at [redacted]w[redacted]d.  Scheduled for induction of labor for IUGR on 12/05/20.   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  Prenatal Labs: Blood type/Rh O Pos  Antibody screen neg  Rubella Immune  Varicella Immune  RPR NR  HBsAg Neg  Hep C Neg  HIV NR  GC neg  Chlamydia neg  Genetic screening cfDNA negative  1 hour GTT Not done late to care  3 hour GTT   GBS pos   Tdap: refused Flu: refused Contraception: IUD considering Post-Placental Feeding preference: Breast  ____ Chari Manning, CNM Certified Nurse Midwife [facility]  Clinic OB/GYN [facility]

## 2020-11-29 ENCOUNTER — Inpatient Hospital Stay: Payer: Medicaid Other | Admitting: Anesthesiology

## 2020-11-29 ENCOUNTER — Inpatient Hospital Stay
Admission: EM | Admit: 2020-11-29 | Discharge: 2020-12-01 | DRG: 805 | Disposition: A | Discharge status: Home / Self Care | Payer: Medicaid Other | Attending: Obstetrics | Admitting: Obstetrics

## 2020-11-29 ENCOUNTER — Other Ambulatory Visit: Payer: Self-pay

## 2020-11-29 DIAGNOSIS — J189 Pneumonia, unspecified organism: Secondary | ICD-10-CM | POA: Diagnosis not present

## 2020-11-29 DIAGNOSIS — O99334 Smoking (tobacco) complicating childbirth: Secondary | ICD-10-CM | POA: Diagnosis present

## 2020-11-29 DIAGNOSIS — O99324 Drug use complicating childbirth: Secondary | ICD-10-CM | POA: Diagnosis present

## 2020-11-29 DIAGNOSIS — F17210 Nicotine dependence, cigarettes, uncomplicated: Secondary | ICD-10-CM | POA: Diagnosis present

## 2020-11-29 DIAGNOSIS — B3731 Acute candidiasis of vulva and vagina: Secondary | ICD-10-CM | POA: Diagnosis present

## 2020-11-29 DIAGNOSIS — Z3490 Encounter for supervision of normal pregnancy, unspecified, unspecified trimester: Secondary | ICD-10-CM | POA: Diagnosis present

## 2020-11-29 DIAGNOSIS — D62 Acute posthemorrhagic anemia: Secondary | ICD-10-CM | POA: Diagnosis not present

## 2020-11-29 DIAGNOSIS — O9081 Anemia of the puerperium: Secondary | ICD-10-CM | POA: Diagnosis not present

## 2020-11-29 DIAGNOSIS — O9952 Diseases of the respiratory system complicating childbirth: Secondary | ICD-10-CM | POA: Diagnosis present

## 2020-11-29 DIAGNOSIS — O9882 Other maternal infectious and parasitic diseases complicating childbirth: Secondary | ICD-10-CM | POA: Diagnosis present

## 2020-11-29 DIAGNOSIS — Z8701 Personal history of pneumonia (recurrent): Secondary | ICD-10-CM | POA: Diagnosis not present

## 2020-11-29 DIAGNOSIS — R059 Cough, unspecified: Secondary | ICD-10-CM

## 2020-11-29 DIAGNOSIS — O26893 Other specified pregnancy related conditions, third trimester: Principal | ICD-10-CM | POA: Diagnosis present

## 2020-11-29 DIAGNOSIS — R079 Chest pain, unspecified: Secondary | ICD-10-CM | POA: Diagnosis not present

## 2020-11-29 DIAGNOSIS — Z3A38 38 weeks gestation of pregnancy: Secondary | ICD-10-CM | POA: Diagnosis not present

## 2020-11-29 DIAGNOSIS — Z20822 Contact with and (suspected) exposure to covid-19: Secondary | ICD-10-CM | POA: Diagnosis present

## 2020-11-29 DIAGNOSIS — Z23 Encounter for immunization: Secondary | ICD-10-CM | POA: Diagnosis not present

## 2020-11-29 DIAGNOSIS — F1290 Cannabis use, unspecified, uncomplicated: Secondary | ICD-10-CM | POA: Diagnosis present

## 2020-11-29 DIAGNOSIS — J4530 Mild persistent asthma, uncomplicated: Secondary | ICD-10-CM | POA: Diagnosis present

## 2020-11-29 LAB — URINE DRUG SCREEN, QUALITATIVE (ARMC ONLY)
Amphetamines, Ur Screen: NOT DETECTED
Barbiturates, Ur Screen: NOT DETECTED
Benzodiazepine, Ur Scrn: NOT DETECTED
Cannabinoid 50 Ng, Ur ~~LOC~~: POSITIVE — AB
Cocaine Metabolite,Ur ~~LOC~~: NOT DETECTED
MDMA (Ecstasy)Ur Screen: NOT DETECTED
Methadone Scn, Ur: NOT DETECTED
Opiate, Ur Screen: NOT DETECTED
Phencyclidine (PCP) Ur S: NOT DETECTED
Tricyclic, Ur Screen: NOT DETECTED

## 2020-11-29 LAB — CBC
HCT: 37 % (ref 36.0–46.0)
Hemoglobin: 13.2 g/dL (ref 12.0–15.0)
MCH: 29.9 pg (ref 26.0–34.0)
MCHC: 35.7 g/dL (ref 30.0–36.0)
MCV: 83.7 fL (ref 80.0–100.0)
Platelets: 181 10*3/uL (ref 150–400)
RBC: 4.42 MIL/uL (ref 3.87–5.11)
RDW: 12.6 % (ref 11.5–15.5)
WBC: 13.2 10*3/uL — ABNORMAL HIGH (ref 4.0–10.5)
nRBC: 0 % (ref 0.0–0.2)

## 2020-11-29 LAB — TYPE AND SCREEN
ABO/RH(D): O POS
Antibody Screen: NEGATIVE

## 2020-11-29 LAB — RESP PANEL BY RT-PCR (FLU A&B, COVID) ARPGX2
Influenza A by PCR: NEGATIVE
Influenza B by PCR: NEGATIVE
SARS Coronavirus 2 by RT PCR: NEGATIVE

## 2020-11-29 MED ORDER — LIDOCAINE HCL (PF) 1 % IJ SOLN
30.0000 mL | INTRAMUSCULAR | Status: DC | PRN
  Filled 2020-11-29: qty 30

## 2020-11-29 MED ORDER — SOD CITRATE-CITRIC ACID 500-334 MG/5ML PO SOLN: 30.0000 mL | ORAL | Status: DC | PRN

## 2020-11-29 MED ORDER — PENICILLIN G POT IN DEXTROSE 60000 UNIT/ML IV SOLN
3.0000 10*6.[IU] | INTRAVENOUS | Status: DC
  Administered 2020-11-29 (×2): 3 10*6.[IU] via INTRAVENOUS
  Filled 2020-11-29 (×8): qty 50

## 2020-11-29 MED ORDER — LIDOCAINE-EPINEPHRINE (PF) 1.5 %-1:200000 IJ SOLN
INTRAMUSCULAR | Status: DC | PRN
  Administered 2020-11-29: 3 mL via EPIDURAL

## 2020-11-29 MED ORDER — FENTANYL-BUPIVACAINE-NACL 0.5-0.125-0.9 MG/250ML-% EP SOLN
EPIDURAL | Status: AC
  Filled 2020-11-29: qty 250

## 2020-11-29 MED ORDER — LIDOCAINE HCL (PF) 1 % IJ SOLN
INTRAMUSCULAR | Status: DC | PRN
  Administered 2020-11-29: 2 mL via SUBCUTANEOUS

## 2020-11-29 MED ORDER — IBUPROFEN 600 MG PO TABS
600.0000 mg | ORAL_TABLET | Freq: Four times a day (QID) | ORAL | Status: DC
  Administered 2020-11-29 – 2020-12-01 (×7): 600 mg via ORAL
  Filled 2020-11-29 (×7): qty 1

## 2020-11-29 MED ORDER — TERBUTALINE SULFATE 1 MG/ML IJ SOLN: 0.2500 mg | Freq: Once | INTRAMUSCULAR | Status: DC | PRN

## 2020-11-29 MED ORDER — FENTANYL-BUPIVACAINE-NACL 0.5-0.125-0.9 MG/250ML-% EP SOLN
EPIDURAL | Status: DC | PRN
  Administered 2020-11-29: 12 mL/h via EPIDURAL

## 2020-11-29 MED ORDER — LIDOCAINE HCL (PF) 1 % IJ SOLN
INTRAMUSCULAR | Status: AC
  Filled 2020-11-29: qty 30

## 2020-11-29 MED ORDER — AMMONIA AROMATIC IN INHA
RESPIRATORY_TRACT | Status: AC
  Filled 2020-11-29: qty 10

## 2020-11-29 MED ORDER — MISOPROSTOL 25 MCG QUARTER TABLET
25.0000 ug | ORAL_TABLET | ORAL | Status: DC | PRN
Start: 2020-11-29 — End: 2020-11-30
  Filled 2020-11-29: qty 1

## 2020-11-29 MED ORDER — ACETAMINOPHEN 325 MG PO TABS: 650.0000 mg | ORAL_TABLET | ORAL | Status: DC | PRN

## 2020-11-29 MED ORDER — LACTATED RINGERS IV SOLN: 500.0000 mL | INTRAVENOUS | Status: DC | PRN

## 2020-11-29 MED ORDER — MISOPROSTOL 200 MCG PO TABS
ORAL_TABLET | ORAL | Status: AC
  Filled 2020-11-29: qty 4

## 2020-11-29 MED ORDER — OXYTOCIN BOLUS FROM INFUSION
333.0000 mL | Freq: Once | INTRAVENOUS | Status: AC
  Administered 2020-11-29: 333 mL via INTRAVENOUS

## 2020-11-29 MED ORDER — SODIUM CHLORIDE 0.9 % IV SOLN
5.0000 10*6.[IU] | Freq: Once | INTRAVENOUS | Status: AC
  Administered 2020-11-29: 5 10*6.[IU] via INTRAVENOUS
  Filled 2020-11-29: qty 5

## 2020-11-29 MED ORDER — OXYTOCIN-SODIUM CHLORIDE 30-0.9 UT/500ML-% IV SOLN
1.0000 m[IU]/min | INTRAVENOUS | Status: DC
  Filled 2020-11-29: qty 500

## 2020-11-29 MED ORDER — ONDANSETRON HCL 4 MG/2ML IJ SOLN
4.0000 mg | Freq: Four times a day (QID) | INTRAMUSCULAR | Status: DC | PRN
  Administered 2020-11-29: 4 mg via INTRAVENOUS
  Filled 2020-11-29: qty 2

## 2020-11-29 MED ORDER — BUTORPHANOL TARTRATE 1 MG/ML IJ SOLN: 1.0000 mg | INTRAMUSCULAR | Status: DC | PRN

## 2020-11-29 MED ORDER — OXYTOCIN 10 UNIT/ML IJ SOLN
INTRAMUSCULAR | Status: AC
  Filled 2020-11-29: qty 2

## 2020-11-29 MED ORDER — OXYTOCIN-SODIUM CHLORIDE 30-0.9 UT/500ML-% IV SOLN: 2.5000 [IU]/h | INTRAVENOUS | Status: DC

## 2020-11-29 MED ORDER — BUPIVACAINE HCL (PF) 0.25 % IJ SOLN
INTRAMUSCULAR | Status: DC | PRN
  Administered 2020-11-29 (×2): 4 mL via EPIDURAL

## 2020-11-29 MED ORDER — LACTATED RINGERS IV SOLN: INTRAVENOUS | Status: DC

## 2020-11-29 NOTE — H&P (Signed)
OB History & Physical   History of Present Illness:   Chief Complaint: painful uterine contractions  HPI:  Kelsey Serrano is a 25 y.o. [phone] female at [redacted]w[redacted]d dated by LMP.  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; currently only using marijuana + UDS at 37 weeks  Patient Active Problem List   Diagnosis Date Noted   Encounter for induction of labor 11/29/2020   Normal labor 05/23/2019   Medication side effect 06/30/2015   Chronic diarrhea of unknown origin    Nausea with vomiting    Gastritis    D (diarrhea) 10/25/2014   Abdominal pain, epigastric 10/25/2014   Calculus of kidney 10/25/2014   Renal colic 10/25/2014   Current smoker 04/12/2013   Chronic cough 01/01/2012   Neurosis, 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  Never IVDA.   prescription drugs, THC, methamphetamines.   B.   Denies any since March 2013.  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  No prior ED visits or hospitalization    Asthma    Calculus of kidney 10/25/2014   Chronic cough 01/01/2012   Overview:  A.  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  Depression, PTSA, mood disorder  NOS B.  Psychiatrist:  Mathews Serrano C.  Therapist:  Elita Serrano    Renal colic 10/25/2014   Renal disorder    Restless leg syndrome     Past Surgical History:  Procedure Laterality Date   COLONOSCOPY WITH PROPOFOL N/A 12/04/2014   Procedure: COLONOSCOPY WITH PROPOFOL;  Surgeon: Midge Minium, MD;  Location: [facility];  Service: Endoscopy;  Laterality: N/A;   DENTAL SURGERY     ESOPHAGOGASTRODUODENOSCOPY (EGD) WITH PROPOFOL N/A 12/04/2014   Procedure: ESOPHAGOGASTRODUODENOSCOPY (EGD) WITH PROPOFOL;  Surgeon: Midge Minium, MD;  Location: [facility];  Service: Endoscopy;  Laterality: N/A;   kidney stent     kidney stones Left    LITHOTRIPSY      Allergies  Allergen Reactions   Augmentin [Amoxicillin-Pot Clavulanate] Nausea And Vomiting   Estrogens Nausea And Vomiting    Pt states she is allergic to high doses of estrogens.   Lavender Oil Hives   Zyrtec [Cetirizine] Nausea And Vomiting    Prior to Admission medications   Medication Sig Start Date End Date Taking? Authorizing Provider  albuterol (VENTOLIN HFA) 108 (90 Base) MCG/ACT inhaler Inhale 2 puffs into the lungs every 6 (six) hours as needed for wheezing or shortness of breath. 10/05/19  Yes Salena Saner, MD  fluticasone-salmeterol (ADVAIR HFA) [id number] MCG/ACT inhaler Inhale 2 puffs into the lungs 2 (two) times daily. 10/05/19  Yes Salena Saner, MD  Prenatal Vit-Fe Fumarate-FA (MULTIVITAMIN-PRENATAL) 27-0.8 MG TABS tablet Take 1 tablet by mouth daily at 12 noon.   Yes [provider]  HYDROcodone-acetaminophen (NORCO/VICODIN) 5-325 MG tablet Take 1 tablet by mouth every 4 (four) hours as needed. Patient not taking: Reported on 11/29/2020 05/06/20   Ward, Layla Maw, DO  ibuprofen (ADVIL) 800 MG tablet Take 1 tablet (800 mg total) by mouth every 8 (eight) hours as needed for mild pain. Patient not taking: Reported on 11/29/2020 05/06/20   Ward, Layla Maw, DO  ondansetron (ZOFRAN ODT) 4 MG disintegrating  tablet Take 1 tablet (4 mg total) by mouth every 6 (six) hours as needed for nausea or vomiting. Patient not taking: Reported on 11/29/2020 05/06/20   Ward, Layla Maw, DO  penicillin v potassium (VEETID) 500 MG tablet Take 1 tablet (500 mg total) by mouth 4 (four) times daily. Patient not taking: Reported on 11/29/2020 05/06/20   Ward, Layla Maw, DO     Prenatal care site:  [facility]  Social History: She  reports that she has been smoking cigarettes. She has a 7.00 pack-year smoking history. She has never used smokeless tobacco. She reports that she does not drink alcohol and does not use drugs.  Family History: family history includes Diabetes in her mother.   Review of Systems: A full review of systems was performed and negative except as noted in the HPI.     Physical Exam:  Vital Signs: BP 131/81 (BP Location: Left Arm)   Pulse 84   Temp 98.8 F (37.1 C) (Oral)   Resp 20   Ht 5\' 2"  (1.575 m)   Wt 72.6 kg   LMP 03/15/2020   SpO2 97%   BMI 29.26 kg/m  Physical Exam  General: no acute distress.  HEENT: normocephalic, atraumatic Heart: regular rate & rhythm.  No murmurs/rubs/gallops Lungs: clear to auscultation bilaterally, normal respiratory effort Abdomen: soft, gravid, non-tender;  EFW: 6.6lbs Pelvic:   External: Normal external female genitalia  Cervix: Dilation: 4 / Effacement (%): 80 / Station: -1, 0    Extremities: non-tender, symmetric, no edema bilaterally.  DTRs: +2  Neurologic: Alert & oriented x 3.    No results found for this or any previous visit (from the past 24 hour(s)).  Pertinent Results:  Prenatal Labs: Blood type/Rh O Po  Antibody screen neg  Rubella Immune  Varicella Immune  RPR NR  HBsAg Neg  HIV NR  GC neg  Chlamydia neg  Genetic screening cfDNA negative   1 hour GTT Not done, A1C 5.4  3 hour GTT   GBS Pos   FHT:  FHR: 125 bpm, variability: moderate,  accelerations:  Present,  decelerations:  Absent Category/reactivity:   Category I UC:   regular, every 3-5 minutes   Cephalic by Leopolds and SVE   No results found.  Assessment:  Kelsey Serrano is a 25 y.o. [phone] female at [redacted]w[redacted]d with .   Plan:  1. Admit to Labor & Delivery; consents reviewed and obtained - Covid admission screen   2. Fetal Well being  - Fetal Tracing: Category 1 - Group B Streptococcus ppx  indicated: GBS positive - Presentation: cephalic confirmed by SVE   3. Routine OB: - Prenatal labs reviewed, as above - Rh Pos - CBC, T&S, RPR on admit - Clear fluids, IVF  4. Monitoring of labor  - Contractions monitored with external toco - Pelvis proven to  5lbs  adequate for trial of  labor  - Plan for expectant management  - Augmentation with oxytocin and AROM as appropriate  - Plan for  continuous fetal monitoring - Maternal pain control as desired; planning regional anesthesia - Anticipate vaginal delivery  5. Post Partum Planning: - Infant feeding: breast - Contraception: considering IUD - Tdap vaccine: refused - Flu vaccine: refused  Chari Manning CNM Certified Nurse Midwife [facility]

## 2020-11-29 NOTE — Anesthesia Preprocedure Evaluation (Signed)
Anesthesia Evaluation  Patient identified by MRN, date of birth, ID band Patient awake    Reviewed: Allergy & Precautions, H&P , NPO status , Patient's Chart, lab work & pertinent test results, reviewed documented beta blocker date and time , Unable to perform ROS - Chart review only  History of Anesthesia Complications (+) PROLONGED EMERGENCE and history of anesthetic complications  Airway Mallampati: II  TM Distance: >3 FB Neck ROM: full    Dental  (+) Dental Advidsory Given   Pulmonary asthma , Current Smoker and Patient abstained from smoking.,    Pulmonary exam normal        Cardiovascular Exercise Tolerance: Good negative cardio ROS Normal cardiovascular exam     Neuro/Psych PSYCHIATRIC DISORDERS Anxiety Depression negative neurological ROS     GI/Hepatic Neg liver ROS, GERD  Controlled,  Endo/Other  Hypothyroidism   Renal/GU Renal disease     Musculoskeletal   Abdominal   Peds  Hematology negative hematology ROS (+)   Anesthesia Other Findings History of oral drug abuse and marijuana use  Reproductive/Obstetrics (+) Pregnancy                             Anesthesia Physical Anesthesia Plan  ASA: 2  Anesthesia Plan: Epidural   Post-op Pain Management:    Induction:   PONV Risk Score and Plan:   Airway Management Planned:   Additional Equipment:   Intra-op Plan:   Post-operative Plan:   Informed Consent: I have reviewed the patients History and Physical, chart, labs and discussed the procedure including the risks, benefits and alternatives for the proposed anesthesia with the patient or authorized representative who has indicated his/her understanding and acceptance.       Plan Discussed with: CRNA and Anesthesiologist  Anesthesia Plan Comments:         Anesthesia Quick Evaluation

## 2020-11-29 NOTE — Anesthesia Procedure Notes (Signed)
Epidural Patient location during procedure: OB Start time: 11/29/2020 12:38 PM End time: 11/29/2020 12:57 PM  Staffing Anesthesiologist: Lenard Simmer, MD Resident/CRNA: Irving Burton, CRNA Performed: resident/CRNA   Preanesthetic Checklist Completed: patient identified, IV checked, site marked, risks and benefits discussed, surgical consent, monitors and equipment checked, pre-op evaluation and timeout performed  Epidural Patient position: sitting Prep: Betadine Patient monitoring: heart rate, continuous pulse ox and blood pressure Approach: midline Location: L4-L5 Injection technique: LOR saline  Needle:  Needle type: Tuohy  Needle gauge: 17 G Needle length: 9 cm and 9 Needle insertion depth: 8 cm Catheter type: closed end flexible Catheter size: 19 Gauge Catheter at skin depth: 12 cm Test dose: negative and 1.5% lidocaine with Epi 1:200 K  Assessment Events: blood not aspirated, injection not painful, no injection resistance, no paresthesia and negative IV test  Additional Notes   Patient tolerated the insertion well without complications.Reason for block:procedure for pain

## 2020-11-29 NOTE — OB Triage Note (Signed)
Pt presents to L/D triage with contractions that began at 0230 this morning and have increased in frequency and intensity. Pt reports positive fetal movement and no bleeding or LOF. Pt reports ongoing productive cough and left-sided heaviness in her chest that worsens with touch or coughing. Initial O2 sat 98. Pt reports finishing antibiotics 2 weeks ago and a negative COVID test.  Monitors applied and assessing- FHT 135.  VSS.

## 2020-11-29 NOTE — Discharge Summary (Addendum)
Obstetrical Discharge Summary  Patient Name: Kelsey Serrano DOB: 10/08/1995 MRN: [id number]  Date of Admission: 11/29/2020 Date of Delivery: 11/29/2020  Delivered by: Chari Manning, CNM Date of Discharge: 12/01/2020  Primary OB: [facility] [id number] last menstrual period was 03/15/2020. 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; currently only using marijuana + UDS at 37 weeks (copy from H&P) Scant prenatal care  Admitting Diagnosis: Labor Secondary Diagnosis: Patient Active Problem List   Diagnosis Date Noted   Encounter for induction of labor 11/29/2020   Normal labor 05/23/2019   Medication side effect 06/30/2015   Chronic diarrhea of unknown origin    Nausea with vomiting    Gastritis    D (diarrhea) 10/25/2014   Abdominal pain, epigastric 10/25/2014   Calculus of kidney 10/25/2014   Renal colic 10/25/2014   Current smoker 04/12/2013   Chronic cough 01/01/2012   Neurosis, 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   Depression, major, recurrent (HCC) 10/02/2011   Airway hyperreactivity 11/07/2010   Abuse, drug or alcohol (HCC) 11/07/2010    Augmentation: N/A Complications: None Intrapartum complications/course: none Delivery Type: spontaneous vaginal delivery Anesthesia: epidural Placenta: spontaneous Laceration: N/A Episiotomy: none Newborn Data: Live born female  Birth Weight:   APGAR: 8, 9  Newborn Delivery   Birth date/time: 11/29/2020 15:02:00 Delivery type: Vaginal, Spontaneous        Postpartum Procedures: none Edinburgh:  Edinburgh Postnatal Depression Scale Screening Tool 11/30/2020 05/25/2019 05/24/2019 05/24/2019  I have been able to laugh and see the funny side of things. 0 0 (No Data) (No Data)  I have looked forward with enjoyment to things. 0 0 - -  I have blamed myself unnecessarily when things went  wrong. 0 1 - -  I have been anxious or worried for no good reason. 1 0 - -  I have felt scared or panicky for no good reason. 0 0 - -  Things have been getting on top of me. 0 1 - -  I have been so unhappy that I have had difficulty sleeping. 0 0 - -  I have felt sad or miserable. 0 0 - -  I have been so unhappy that I have been crying. 0 1 - -  The thought of harming myself has occurred to me. 0 0 - -  Edinburgh Postnatal Depression Scale Total 1 3 - -     Post partum course:  Patient had an postpartum course complicated by pneumonia, diagnosed on PPD1.  She was experiencing chest pain x3 days. Hospitalist evaluated her and was managing her antibiotics and breathing treatments. By PPD2, VSS, no SOB or increased work of breathing, labs stable, chest x-ray unremarkable, sitting up in bed and talking with ease. By time of discharge on PPD#2, her pain was controlled on oral pain medications; she had appropriate lochia and was ambulating, voiding without difficulty and tolerating regular diet.  She was deemed stable for discharge to home by Dr. Maryfrances Bunnell, Hospitalist and D. Andrey Campanile, CNM.    Discharge Physical Exam:  BP 107/68 (BP Location: Left Arm)   Pulse 66   Temp 98.4 F (36.9 C) (Oral)   Resp 20   Ht 5\' 2"  (1.575 m)   Wt 72.6 kg   LMP 03/15/2020   SpO2 100%   Breastfeeding Unknown   BMI 29.26 kg/m   General: NAD CV: RRR Pulm: lungs clear, diminished, nl  effort ABD: s/nd/nt, fundus firm and below the umbilicus Lochia: moderate Perineum:minimal edema/intact DVT Evaluation: LE non-ttp, no evidence of DVT on exam.  Hemoglobin  Date Value Ref Range Status  12/01/2020 11.4 (L) 12.0 - 15.0 g/dL Final   HGB  Date Value Ref Range Status  06/26/2014 14.7 12.0 - 16.0 g/dL Final   HCT  Date Value Ref Range Status  12/01/2020 33.8 (L) 36.0 - 46.0 % Final  06/26/2014 44.4 35.0 - 47.0 % Final     Disposition: stable, discharge to home. Baby Feeding: breastmilk Baby Disposition:  home with mom  Rh Immune globulin given: N/A Rubella vaccine given: Immune Varivax vaccine given: Immune Flu vaccine given in AP or PP setting: declined Tdap vaccine given in AP or PP setting: Declined  Contraception: IUD (undecided about which one, advised to decide by 2wks postpartum)  Prenatal Labs:  Blood type/Rh O Po  Antibody screen neg  Rubella Immune  Varicella Immune  RPR NR  HBsAg Neg  HIV NR  GC neg  Chlamydia neg  Genetic screening cfDNA negative   1 hour GTT Not done, A1C 5.4  3 hour GTT    GBS Pos    Plan:  GENELDA ROARK was discharged to home in good condition. Follow-up appointment with primary care provider in 1 week. Follow-up appointment with delivering provider in 6 weeks.  Discharge Medications: Allergies as of 12/01/2020       Reactions   Augmentin [amoxicillin-pot Clavulanate] Nausea And Vomiting   Estrogens Nausea And Vomiting   Pt states she is allergic to high doses of estrogens.   Lavender Oil Hives   Zyrtec [cetirizine] Nausea And Vomiting        Medication List     STOP taking these medications    HYDROcodone-acetaminophen 5-325 MG tablet Commonly known as: NORCO/VICODIN   ondansetron 4 MG disintegrating tablet Commonly known as: Zofran ODT   penicillin v potassium 500 MG tablet Commonly known as: VEETID       TAKE these medications    acetaminophen 325 MG tablet Commonly known as: Tylenol Take 2 tablets (650 mg total) by mouth every 4 (four) hours as needed (for pain scale < 4).   Advair HFA 115-21 MCG/ACT inhaler Generic drug: fluticasone-salmeterol Inhale 2 puffs into the lungs 2 (two) times daily.   albuterol 108 (90 Base) MCG/ACT inhaler Commonly known as: VENTOLIN HFA Inhale 2 puffs into the lungs every 6 (six) hours as needed for wheezing or shortness of breath.   amoxicillin-clavulanate 875-125 MG tablet Commonly known as: Augmentin Take 1 tablet by mouth 2 (two) times daily for 4 days.    benzocaine-Menthol 20-0.5 % Aero Commonly known as: DERMOPLAST Apply 1 application topically as needed for irritation (perineal discomfort).   dibucaine 1 % Oint Commonly known as: NUPERCAINAL Place 1 application rectally as needed for hemorrhoids (if tucks not working).   ibuprofen 800 MG tablet Commonly known as: ADVIL Take 1 tablet (800 mg total) by mouth every 8 (eight) hours as needed for mild pain.   multivitamin-prenatal 27-0.8 MG Tabs tablet Take 1 tablet by mouth daily at 12 noon.   ondansetron 4 MG tablet Commonly known as: Zofran Take 1 tablet (4 mg total) by mouth daily as needed for nausea or vomiting.   senna-docusate 8.6-50 MG tablet Commonly known as: Senokot-S Take 2 tablets by mouth daily. Start taking on: December 02, 2020   simethicone 80 MG chewable tablet Commonly known as: MYLICON Chew 1 tablet (80 mg total)  by mouth as needed for flatulence.   witch hazel-glycerin pad Commonly known as: TUCKS Apply 1 application topically as needed for hemorrhoids (for pain).         Follow-up Information     Chari Manning Rolla Plate, CNM Follow up in 2 week(s).   Specialty: Obstetrics Why: pneumonia follow-up Contact information: [address] [place] [address] [phone]         Chari Manning Rolla Plate, CNM Follow up in 6 week(s).   Specialty: Obstetrics Why: routine postpartum Contact information: [address] [place] [address] [phone]         Bynum, Windell Norfolk Kudumu, MD Follow up in 1 week(s).   Specialty: Family Medicine Why: pneumonia follow-up Contact information: [address] [place] [address] [phone]                 Signed: Janyce Llanos, CNM 12/01/2020 10:06 AM

## 2020-11-30 ENCOUNTER — Inpatient Hospital Stay: Payer: Medicaid Other

## 2020-11-30 ENCOUNTER — Encounter: Payer: Self-pay | Admitting: Obstetrics and Gynecology

## 2020-11-30 DIAGNOSIS — R079 Chest pain, unspecified: Secondary | ICD-10-CM

## 2020-11-30 DIAGNOSIS — Z3490 Encounter for supervision of normal pregnancy, unspecified, unspecified trimester: Secondary | ICD-10-CM

## 2020-11-30 LAB — CBC
HCT: 33 % — ABNORMAL LOW (ref 36.0–46.0)
Hemoglobin: 11 g/dL — ABNORMAL LOW (ref 12.0–15.0)
MCH: 28.1 pg (ref 26.0–34.0)
MCHC: 33.3 g/dL (ref 30.0–36.0)
MCV: 84.2 fL (ref 80.0–100.0)
Platelets: 147 10*3/uL — ABNORMAL LOW (ref 150–400)
RBC: 3.92 MIL/uL (ref 3.87–5.11)
RDW: 12.9 % (ref 11.5–15.5)
WBC: 14.3 10*3/uL — ABNORMAL HIGH (ref 4.0–10.5)
nRBC: 0 % (ref 0.0–0.2)

## 2020-11-30 LAB — TROPONIN I (HIGH SENSITIVITY): Troponin I (High Sensitivity): 10 ng/L (ref <18)

## 2020-11-30 LAB — PROCALCITONIN: Procalcitonin: <0.1 ng/mL

## 2020-11-30 MED ORDER — DIBUCAINE (PERIANAL) 1 % EX OINT: 1.0000 "application " | TOPICAL_OINTMENT | CUTANEOUS | Status: DC | PRN

## 2020-11-30 MED ORDER — BENZOCAINE-MENTHOL 20-0.5 % EX AERO
1.0000 | INHALATION_SPRAY | CUTANEOUS | Status: DC | PRN
Start: 2020-11-30 — End: 2020-12-01

## 2020-11-30 MED ORDER — SODIUM CHLORIDE 0.9% FLUSH: 3.0000 mL | INTRAVENOUS | Status: DC | PRN

## 2020-11-30 MED ORDER — SODIUM CHLORIDE 0.9 % IV SOLN: 250.0000 mL | INTRAVENOUS | Status: DC | PRN

## 2020-11-30 MED ORDER — DIPHENHYDRAMINE HCL 25 MG PO CAPS
25.0000 mg | ORAL_CAPSULE | Freq: Four times a day (QID) | ORAL | Status: DC | PRN
Start: 2020-11-30 — End: 2020-12-01

## 2020-11-30 MED ORDER — SODIUM CHLORIDE 0.9% FLUSH
3.0000 mL | Freq: Two times a day (BID) | INTRAVENOUS | Status: DC
  Administered 2020-11-30: 3 mL via INTRAVENOUS

## 2020-11-30 MED ORDER — OXYCODONE HCL 5 MG PO TABS
5.0000 mg | ORAL_TABLET | ORAL | Status: DC | PRN
  Administered 2020-11-30 – 2020-12-01 (×4): 5 mg via ORAL
  Filled 2020-11-30 (×4): qty 1

## 2020-11-30 MED ORDER — METHYLPREDNISOLONE SODIUM SUCC 40 MG IJ SOLR: 40.0000 mg | Freq: Every day | INTRAMUSCULAR | Status: DC

## 2020-11-30 MED ORDER — INFLUENZA VAC SPLIT QUAD 0.5 ML IM SUSY: 0.5000 mL | PREFILLED_SYRINGE | INTRAMUSCULAR | Status: DC

## 2020-11-30 MED ORDER — FLEET ENEMA 7-19 GM/118ML RE ENEM: 1.0000 | ENEMA | Freq: Every day | RECTAL | Status: DC | PRN

## 2020-11-30 MED ORDER — GUAIFENESIN ER 600 MG PO TB12
1200.0000 mg | ORAL_TABLET | Freq: Two times a day (BID) | ORAL | Status: DC
  Administered 2020-11-30 – 2020-12-01 (×2): 1200 mg via ORAL
  Filled 2020-11-30 (×3): qty 2

## 2020-11-30 MED ORDER — ONDANSETRON HCL 4 MG/2ML IJ SOLN: 4.0000 mg | INTRAMUSCULAR | Status: DC | PRN

## 2020-11-30 MED ORDER — SIMETHICONE 80 MG PO CHEW: 80.0000 mg | CHEWABLE_TABLET | ORAL | Status: DC | PRN

## 2020-11-30 MED ORDER — SODIUM CHLORIDE 0.9 % IV SOLN: 500.0000 mg | INTRAVENOUS | Status: DC

## 2020-11-30 MED ORDER — TETANUS-DIPHTH-ACELL PERTUSSIS 5-2.5-18.5 LF-MCG/0.5 IM SUSY
0.5000 mL | PREFILLED_SYRINGE | Freq: Once | INTRAMUSCULAR | Status: AC
  Administered 2020-12-01: 0.5 mL via INTRAMUSCULAR
  Filled 2020-11-30: qty 0.5

## 2020-11-30 MED ORDER — BISACODYL 10 MG RE SUPP: 10.0000 mg | Freq: Every day | RECTAL | Status: DC | PRN

## 2020-11-30 MED ORDER — IPRATROPIUM-ALBUTEROL 0.5-2.5 (3) MG/3ML IN SOLN
3.0000 mL | Freq: Four times a day (QID) | RESPIRATORY_TRACT | Status: DC
  Administered 2020-11-30 – 2020-12-01 (×3): 3 mL via RESPIRATORY_TRACT
  Filled 2020-11-30 (×7): qty 3

## 2020-11-30 MED ORDER — PRENATAL MULTIVITAMIN CH
1.0000 | ORAL_TABLET | Freq: Every day | ORAL | Status: DC
Start: 2020-11-30 — End: 2020-12-01
  Administered 2020-11-30: 1 via ORAL
  Filled 2020-11-30: qty 1

## 2020-11-30 MED ORDER — MEASLES, MUMPS & RUBELLA VAC IJ SOLR
0.5000 mL | INTRAMUSCULAR | Status: DC | PRN
  Filled 2020-11-30: qty 0.5

## 2020-11-30 MED ORDER — SODIUM CHLORIDE 3 % IN NEBU
4.0000 mL | INHALATION_SOLUTION | Freq: Every day | RESPIRATORY_TRACT | Status: DC
  Administered 2020-12-01: 4 mL via RESPIRATORY_TRACT
  Filled 2020-11-30: qty 4

## 2020-11-30 MED ORDER — METHYLPREDNISOLONE SODIUM SUCC 40 MG IJ SOLR
40.0000 mg | Freq: Every day | INTRAMUSCULAR | Status: DC
  Administered 2020-11-30 – 2020-12-01 (×2): 40 mg via INTRAVENOUS
  Filled 2020-11-30 (×2): qty 1

## 2020-11-30 MED ORDER — WITCH HAZEL-GLYCERIN EX PADS: 1.0000 "application " | MEDICATED_PAD | CUTANEOUS | Status: DC | PRN

## 2020-11-30 MED ORDER — CEFTRIAXONE SODIUM 2 G IJ SOLR
2.0000 g | INTRAMUSCULAR | Status: DC
Start: 2020-11-30 — End: 2020-11-30

## 2020-11-30 MED ORDER — COCONUT OIL OIL: 1.0000 "application " | TOPICAL_OIL | Status: DC | PRN

## 2020-11-30 MED ORDER — SODIUM CHLORIDE 0.9 % IV SOLN
2.0000 g | INTRAVENOUS | Status: DC
  Administered 2020-11-30: 2 g via INTRAVENOUS
  Filled 2020-11-30: qty 2
  Filled 2020-11-30: qty 20

## 2020-11-30 MED ORDER — ACETAMINOPHEN 325 MG PO TABS
650.0000 mg | ORAL_TABLET | ORAL | Status: DC | PRN
  Administered 2020-11-30 – 2020-12-01 (×4): 650 mg via ORAL
  Filled 2020-11-30 (×4): qty 2

## 2020-11-30 MED ORDER — ONDANSETRON HCL 4 MG PO TABS
4.0000 mg | ORAL_TABLET | ORAL | Status: DC | PRN
  Administered 2020-12-01: 4 mg via ORAL
  Filled 2020-11-30: qty 1

## 2020-11-30 MED ORDER — SENNOSIDES-DOCUSATE SODIUM 8.6-50 MG PO TABS
2.0000 | ORAL_TABLET | ORAL | Status: DC
Start: 2020-11-30 — End: 2020-12-01
  Administered 2020-11-30: 2 via ORAL
  Filled 2020-11-30 (×2): qty 2

## 2020-11-30 MED ORDER — AZITHROMYCIN 500 MG IV SOLR
500.0000 mg | INTRAVENOUS | Status: DC
  Administered 2020-12-01: 500 mg via INTRAVENOUS
  Filled 2020-11-30 (×2): qty 500

## 2020-11-30 MED ORDER — ZOLPIDEM TARTRATE 5 MG PO TABS
5.0000 mg | ORAL_TABLET | Freq: Every evening | ORAL | Status: DC | PRN
Start: 2020-11-30 — End: 2020-12-01

## 2020-11-30 NOTE — Anesthesia Postprocedure Evaluation (Signed)
Anesthesia Post Note  Patient: Kelsey Serrano  Procedure(s) Performed: AN AD HOC LABOR EPIDURAL  Patient location during evaluation: Mother Baby Anesthesia Type: Epidural Level of consciousness: awake and alert Pain management: pain level controlled Vital Signs Assessment: post-procedure vital signs reviewed and stable Respiratory status: spontaneous breathing, nonlabored ventilation and respiratory function stable Cardiovascular status: stable Postop Assessment: no headache, no backache, able to ambulate and adequate PO intake Anesthetic complications: no   No notable events documented.   Last Vitals:  Vitals:   11/30/20 0325 11/30/20 0818  BP: 109/77 120/69  Pulse: (!) 51 62  Resp: 18 20  Temp: 36.5 C 36.8 C  SpO2: 98% 100%    Last Pain:  Vitals:   11/30/20 0818  TempSrc: Oral  PainSc:                  Lenard Simmer

## 2020-11-30 NOTE — Lactation Note (Addendum)
This note was copied from a baby's chart. Lactation Consultation Note  Patient Name: Kelsey Serrano [id number] Date: 11/30/2020 Reason for consult: Initial assessment;Early term 37-38.6wks Age:25 hours  Maternal Data  Mom awakened from sleeping, breastfed her other children x 6 mths, states this baby nurses well and she hears swallows while nursing  Feeding Mother's Current Feeding Choice: Breast Milk Mom states that baby had nursed well with swallows at 1300 LATCH Score Latch:  (I did not observe a feeeding)                  Lactation Tools Discussed/Used  [name] name and no written on white board  Interventions    Discharge Pump: Manual WIC Program: No (other children are on [facility]) DEBP pump kit given to pt per her request Consult Status Consult Status: PRN    Ferol Luz 11/30/2020, 3:54 PM

## 2020-11-30 NOTE — Consult Note (Addendum)
Medical Consultation   Kelsey Serrano  HWE:[id number]  DOB: 06-20-1995  DOA: 11/29/2020  PCP: Erin Fulling, MD (Inactive)   Outpatient Specialists: OB/GYN, pulmonary.   Requesting physician: Dr. Dala Dock, OBGYN  Reason for consultation: Chest pain   History of Present Illness: Kelsey Serrano is an 25 y.o. female with past medical history significant for mild persistent asthma, recently treated bacterial bronchitis on 11/04/2020 [facility], with multiple pulmonary lung nodules seen on CT chest 10/20/2019, tobacco use disorder, THC use disorder, nonbleeding internal hemorrhoids seen on colonoscopy 12/04/2014, gastritis seen on EGD 12/04/2014 who initially presented at [facility] on 11/29/2020 due to painful uterine contractions.  She presented to L&D for labor and delivery.  She gave birth to a girl on 11/29/2020.  Today patient complaints of left lateral chest pain, 7 out of 10, sharp, nonradiating, worse with inspiration.  Associated with a productive cough of yellow sputum.  States her chest pain has been intermittent for the past 3 days.  Denies any recent subjective fevers or chills.  She has a history of pneumonia and feels that her symptoms are similar to her prior pneumonia.  She was recently treated for bacterial bronchitis on 11/04/2020, was prescribed Z-Pak.    On exam she has a wet cough and left anterior chest crackles on auscultation.  Mild diffuse wheezing noted.  Personally reviewed her CXR done at bedside, left lower lobes infiltrates present versus atelectasis or scarring.  Due to concern for developing pneumonia started on Rocephin and IV azithromycin x 5 days.  IV solumedrol for wheezing daily x 5 days.  Pharmacy consulted to assist with medications as patient is breastfeeding and would like to avoid medications that would interfere with breastfeeding or have adverse effects on her newborn.  Review of Systems:  ROS As per HPI otherwise 10 point review of systems  negative.     Past Medical History: Past Medical History:  Diagnosis Date   Abuse, drug or alcohol (HCC) 11/07/2010   Overview:  A.  Never IVDA.   prescription drugs, THC, methamphetamines.   B.   Denies any since March 2013.  No methamphetamines 04/24/2009 Addendum [name]- NCCSR check is negative, reviewed with PCP do not think current problem    Airway hyperreactivity 11/07/2010   Overview:  A.  No prior ED visits or hospitalization    Asthma    Calculus of kidney 10/25/2014   Chronic cough 01/01/2012   Overview:  A.  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  Depression, PTSA, mood disorder NOS B.  Psychiatrist:  Mathews Argyle C.  Therapist:  Elita Quick    Renal colic 10/25/2014   Renal disorder    Restless leg syndrome     Past Surgical History: Past Surgical History:  Procedure Laterality Date   COLONOSCOPY WITH PROPOFOL N/A 12/04/2014   Procedure: COLONOSCOPY WITH PROPOFOL;  Surgeon: Midge Minium, MD;  Location: [facility];  Service: Endoscopy;  Laterality: N/A;   DENTAL SURGERY     ESOPHAGOGASTRODUODENOSCOPY (EGD) WITH PROPOFOL N/A 12/04/2014   Procedure: ESOPHAGOGASTRODUODENOSCOPY (EGD) WITH PROPOFOL;  Surgeon: Midge Minium,  MD;  Location: [facility];  Service: Endoscopy;  Laterality: N/A;   kidney stent     kidney stones Left    LITHOTRIPSY       Allergies:   Allergies  Allergen Reactions   Augmentin [Amoxicillin-Pot Clavulanate] Nausea And Vomiting   Estrogens Nausea And Vomiting    Pt states she is allergic to high doses of estrogens.   Lavender Oil Hives   Zyrtec [Cetirizine] Nausea And Vomiting     Social History:  reports that she has been smoking cigarettes. She has a 7.00 pack-year  smoking history. She has never used smokeless tobacco. She reports that she does not drink alcohol and does not use drugs.   Family History: Family History  Problem Relation Age of Onset   Diabetes Mother     Physical Exam: Vitals:   11/30/20 0325 11/30/20 0818 11/30/20 1542 11/30/20 2000  BP: 109/77 120/69 105/62 126/86  Pulse: (!) 51 62 64 64  Resp: 18 20 18 18   Temp: 97.7 F (36.5 C) 98.3 F (36.8 C) 98.7 F (37.1 C) 98 F (36.7 C)  TempSrc: Oral Oral Oral Oral  SpO2: 98% 100%  99%  Weight:      Height:        Constitutional: Alert and awake, oriented x3, not in any acute distress. Eyes: PERLA, EOMI, irises appear normal, anicteric sclera,  ENMT: external ears and nose appear normal, normal hearing or hard of hearing.  Lips appears normal, oropharynx mucosa, tongue, posterior pharynx appear normal  Neck: neck appears normal, no masses, normal ROM, no thyromegaly, no JVD  CVS: S1-S2 clear, no murmur rubs or gallops, no LE edema, normal pedal pulses  Respiratory: Mild rales left anterior chest.  Mild diffuse wheezing bilaterally. Respiratory effort normal. No accessory muscle use.  Abdomen: soft nontender, nondistended, normal bowel sounds, no hepatosplenomegaly, no hernias  Musculoskeletal: : no cyanosis, clubbing or edema noted bilaterally Neuro: Cranial nerves II-XII intact, strength, sensation, reflexes Psych: judgement and insight appear normal, stable mood and affect, mental status Skin: no rashes or lesions or ulcers, no induration or nodules   Data reviewed:  I have personally reviewed following labs and imaging studies Labs:  CBC: Recent Labs  Lab 11/29/20 1127 11/30/20 0426  WBC 13.2* 14.3*  HGB 13.2 11.0*  HCT 37.0 33.0*  MCV 83.7 84.2  PLT 181 147*    Basic Metabolic Panel: No results for input(s): NA, K, CL, CO2, GLUCOSE, BUN, CREATININE, CALCIUM, MG, PHOS in the last 168 hours. GFR CrCl cannot be calculated (Patient's most recent lab result is  older than the maximum 21 days allowed.). Liver Function Tests: No results for input(s): AST, ALT, ALKPHOS, BILITOT, PROT, ALBUMIN in the last 168 hours. No results for input(s): LIPASE, AMYLASE in the last 168 hours. No results for input(s): AMMONIA in the last 168 hours. Coagulation profile No results for input(s): INR, PROTIME in the last 168 hours.  Cardiac Enzymes: No results for input(s): CKTOTAL, CKMB, CKMBINDEX, TROPONINI in the last 168 hours. BNP: Invalid input(s): POCBNP CBG: No results for input(s): GLUCAP in the last 168 hours. D-Dimer No results for input(s): DDIMER in the last 72 hours. Hgb A1c No results for input(s): HGBA1C in the last 72 hours. Lipid Profile No results for input(s): CHOL, HDL, LDLCALC, TRIG, CHOLHDL, LDLDIRECT in the last 72 hours. Thyroid function studies No results for input(s): TSH, T4TOTAL, T3FREE, THYROIDAB in the last 72 hours.  Invalid input(s): FREET3 Anemia work up No results for input(s):  VITAMINB12, FOLATE, FERRITIN, TIBC, IRON, RETICCTPCT in the last 72 hours. Urinalysis    Component Value Date/Time   COLORURINE YELLOW (A) 01/09/2015 0425   APPEARANCEUR CLEAR (A) 01/09/2015 0425   LABSPEC 1.017 01/09/2015 0425   PHURINE 8.0 01/09/2015 0425   GLUCOSEU NEGATIVE 01/09/2015 0425   HGBUR NEGATIVE 01/09/2015 0425   BILIRUBINUR NEGATIVE 01/09/2015 0425   KETONESUR NEGATIVE 01/09/2015 0425   PROTEINUR NEGATIVE 01/09/2015 0425   NITRITE NEGATIVE 01/09/2015 0425   LEUKOCYTESUR NEGATIVE 01/09/2015 0425     Microbiology Recent Results (from the past 240 hour(s))  Resp Panel by RT-PCR (Flu A&B, Covid) Nasopharyngeal Swab     Status: None   Collection Time: 11/29/20 10:16 AM   Specimen: Nasopharyngeal Swab; Nasopharyngeal(NP) swabs in vial transport medium  Result Value Ref Range Status   SARS Coronavirus 2 by RT PCR NEGATIVE NEGATIVE Final    Comment: (NOTE) SARS-CoV-2 target nucleic acids are NOT DETECTED.  The SARS-CoV-2 RNA is  generally detectable in upper respiratory specimens during the acute phase of infection. The lowest concentration of SARS-CoV-2 viral copies this assay can detect is 138 copies/mL. A negative result does not preclude SARS-Cov-2 infection and should not be used as the sole basis for treatment or other patient management decisions. A negative result may occur with  improper specimen collection/handling, submission of specimen other than nasopharyngeal swab, presence of viral mutation(s) within the areas targeted by this assay, and inadequate number of viral copies(<138 copies/mL). A negative result must be combined with clinical observations, patient history, and epidemiological information. The expected result is Negative.  Fact Sheet for Patients:  BloggerCourse.com  Fact Sheet for Healthcare Providers:  SeriousBroker.it  This test is no t yet approved or cleared by the [place] FDA and  has been authorized for detection and/or diagnosis of SARS-CoV-2 by FDA under an Emergency Use Authorization (EUA). This EUA will remain  in effect (meaning this test can be used) for the duration of the COVID-19 declaration under Section 564(b)(1) of the Act, 21 U.S.C.section 360bbb-3(b)(1), unless the authorization is terminated  or revoked sooner.       Influenza A by PCR NEGATIVE NEGATIVE Final   Influenza B by PCR NEGATIVE NEGATIVE Final    Comment: (NOTE) The Xpert Xpress SARS-CoV-2/FLU/RSV plus assay is intended as an aid in the diagnosis of influenza from Nasopharyngeal swab specimens and should not be used as a sole basis for treatment. Nasal washings and aspirates are unacceptable for Xpert Xpress SARS-CoV-2/FLU/RSV testing.  Fact Sheet for Patients: BloggerCourse.com  Fact Sheet for Healthcare Providers: SeriousBroker.it  This test is not yet approved or cleared by the [place] FDA and has been authorized for detection and/or diagnosis of SARS-CoV-2 by FDA under an Emergency Use Authorization (EUA). This EUA will remain in effect (meaning this test can be used) for the duration of the COVID-19 declaration under Section 564(b)(1) of the Act, 21 U.S.C. section 360bbb-3(b)(1), unless the authorization is terminated or revoked.  Performed at [facility], [address] [place], [place] [address]        Inpatient Medications:   Scheduled Meds:  ibuprofen  600 mg Oral Q6H   ipratropium-albuterol  3 mL Nebulization Q6H   prenatal multivitamin  1 tablet Oral Q1200   senna-docusate  2 tablet Oral Q24H   sodium chloride flush  3 mL Intravenous Q12H   Tdap  0.5 mL Intramuscular Once   Continuous Infusions:  sodium chloride     oxytocin  Radiological Exams on Admission: DG Chest Port 1 View  Result Date: 11/30/2020 CLINICAL DATA:  Cough. EXAM: PORTABLE CHEST 1 VIEW COMPARISON:  October 05, 2019 FINDINGS: The heart size and mediastinal contours are within normal limits. Both lungs are clear. The visualized skeletal structures are unremarkable. IMPRESSION: No active disease. Electronically Signed   By: Aram Candela M.D.   On: 11/30/2020 21:50    Impression/Recommendations Active Problems:   Encounter for induction of labor  Presumptive CAP in the setting of asthma and ongoing tobacco use, POA Concern for developing pneumonia Personally reviewed chest CXR which shows left lower lobes infiltrates versus atelectasis versus scarring.  Sputum culture and procalcitonin ordered, follow results. Start Rocephin and IV azithromycin, daily x5 days, can switch to oral antibiotics to complete 5-7 days total, once her cough is improved. IV Solu-Medrol 40 mg daily x5 days, can switch to prednisone to complete 5 to 7 days total once her cough is improved. DuoNebs every 6 hours Saline nebs daily x3 days, Mucinex 1200 mg twice daily x3  days *Pharmacy consulted to assist with medications, to ensure no contraindication to breast-feeding.*  Chest pain, likely pleuritic from cough. Gave birth yesterday, no hemoptysis, not hypoxic, O2 saturation 99% on room air. Left lateral sharp chest pain worse with inspiration and cough Troponin negative, 10. No evidence of acute ischemia on 12-lead EKG. Analgesics, Tylenol, as needed  Tobacco use disorder She cut down to 1 to 2 cigarettes/day Encourage complete tobacco cessation  THC use disorder Encourage complete cessation of THC use  Acute blood loss anemia, postpartum Hemoglobin 11 K from 13.2 K Management per primary team.    Thank you for this consultation.  Our [facility] hospitalist team will follow the patient with you.   Time Spent: 65 minutes  Darlin Drop M.D. [facility] 11/30/2020, 9:56 PM

## 2020-11-30 NOTE — Progress Notes (Signed)
Kelsey Serrano CNM talked on the phone with Dr.Hall, the Hospitalist on call.

## 2020-11-30 NOTE — Progress Notes (Signed)
Post Partum Day 1 Subjective: Doing well, no complaints.  Tolerating regular diet, pain with PO meds, voiding and ambulating without difficulty.  No CP SOB Fever,Chills, N/V or leg pain; denies nipple or breast pain, no HA change of vision, RUQ/epigastric pain  Objective: BP 105/62 (BP Location: Left Arm)   Pulse 64   Temp 98.7 F (37.1 C) (Oral)   Resp 18   Ht 5\' 2"  (1.575 m)   Wt 72.6 kg   LMP 03/15/2020   SpO2 100%   Breastfeeding Unknown   BMI 29.26 kg/m    Physical Exam:  General: NAD Breasts: soft/nontender CV: RRR Pulm: nl effort, CTABL Perineum: minimal edema, intact Lochia: small Uterine Fundus: fundus firm and 1 fb below umbilicus DVT Evaluation: no cords, ttp LEs   Recent Labs    11/29/20 1127 11/30/20 0426  HGB 13.2 11.0*  HCT 37.0 33.0*  WBC 13.2* 14.3*  PLT 181 147*    Assessment/Plan: 25 y.o. 22 postpartum day # 1  - Continue routine PP care - encouraged to stop smoking, offered nicotine patch.  - Lactation consult prn.  - Discussed contraceptive options including implant, IUDs hormonal and non-hormonal, injection, pills/ring/patch, condoms, and NFP.  - Acute blood loss anemia - hemodynamically stable and asymptomatic; start po ferrous sulfate BID with stool softeners  - Immunization status: Needs tdap prior to DC    Disposition: Does not desire Dc home today.    [id number], CNM 11/30/2020  3:50 PM

## 2020-11-30 NOTE — Progress Notes (Addendum)
Barbara Cower RT was called and notified of a 12 lead EKG and Duo nebs every 6 hours ordered.

## 2020-11-30 NOTE — Progress Notes (Signed)
S: pt called out to nursing with c/o left side chest/back pain that occurs with deep inspiration. CNM received phone call from nursing, at bedside for eval.   Pt c/o 7/10 pain with inspiration that has been present for 3 days. - Reports discomfort is worse with coughing, and reports coughing up yellow thick mucus.  - reports "nothing" seems to help with her discomfort.  - Treated around 11/04/20 for bronchitis with a Z-pack, reports history of asthma, but that albuterol does not help with discomfort.   - This pregnancy uncomplicated except for minimal prenatal care, PPD1 after uncomplicated vaginal delivery. Pt is a current tobacco and MJ user, prior hx amphetamine use in early teens.   - History of pneumonia and similar left side chest pain early in her previous pregnancy 10/2018. CT of the chest, date 10/30/2018 demonstrated multiple bilateral pulmonary nodules and masses some of which with cavitation are highly concerning for an atypical multifocal infectious process, such as TB. She had a negative PPD at that time, and improved WBC and pain after IV abx.    O:  Vitals:   11/29/20 1540 11/29/20 1555 11/29/20 1610 11/29/20 1640  BP: 121/67 104/66 137/76 118/67   11/29/20 1655 11/29/20 1845 11/29/20 2027 11/30/20 0142  BP: 137/73 125/71 138/80 100/61   11/30/20 0325 11/30/20 0818 11/30/20 1542 11/30/20 2000  BP: 109/77 120/69 105/62 126/86   BP 126/86 (BP Location: Left Arm)   Pulse 64   Temp 98 F (36.7 C) (Oral)   Resp 18   Ht 5\' 2"  (1.575 m)   Wt 72.6 kg   LMP 03/15/2020   SpO2 99%   Breastfeeding Unknown   BMI 29.26 kg/m   Lungs bilaterally clear to auscultation, no wheeze, rales.   Pt has been leaving the unit at times to smoke cigarettes per nursing notes.   Component     Latest Ref Rng & Units 11/29/2020 11/30/2020  WBC     4.0 - 10.5 K/uL 13.2 (H) 14.3 (H)  RBC     3.87 - 5.11 MIL/uL 4.42 3.92  Hemoglobin     12.0 - 15.0 g/dL 12/02/2020 12.4 (L)  HCT     36.0 - 46.0 % 37.0  33.0 (L)  MCV     80.0 - 100.0 fL 83.7 84.2  MCH     26.0 - 34.0 pg 29.9 28.1  MCHC     30.0 - 36.0 g/dL 58.0 99.8  RDW     33.8 - 15.5 % 12.6 12.9  Platelets     150 - 400 K/uL 181 147 (L)  nRBC     0.0 - 0.2 % 0.0 0.0  Tricyclic, Ur Screen     NONE DETECTED NONE DETECTED   Amphetamines, Ur Screen     NONE DETECTED NONE DETECTED   MDMA (Ecstasy)Ur Screen     NONE DETECTED NONE DETECTED   Cocaine Metabolite,Ur [place]     NONE DETECTED NONE DETECTED   Opiate, Ur Screen     NONE DETECTED NONE DETECTED   Phencyclidine (PCP) Ur S     NONE DETECTED NONE DETECTED   Cannabinoid 50 Ng, Ur [place]     NONE DETECTED POSITIVE (A)   Barbiturates, Ur Screen     NONE DETECTED NONE DETECTED   Benzodiazepine, Ur Scrn     NONE DETECTED NONE DETECTED   Methadone Scn, Ur     NONE DETECTED NONE DETECTED   SARS Coronavirus 2 by RT PCR  NEGATIVE NEGATIVE   Influenza A By PCR     NEGATIVE NEGATIVE   Influenza B By PCR     NEGATIVE NEGATIVE     A: Chest Pain with inspiration of unknown etiology  P: DDX including pleurisy, bronchitis, asthma, PE  - Low suspicion for acute process due to 3 day hx of discomfort, stable vital signs.  - Hospitalist consult requested for further eval, pending call back.   Randa Ngo, CNM 11/30/2020 9:03 PM

## 2020-11-30 NOTE — Progress Notes (Addendum)
Pt was educated concerning her cigarette smoking habit. Pt's IV was removed in order to ensure pt's safety. Pt was adamant about having cigarettes throughout the shift. Pt was monitored throughout the night when she requested to go outside, RN was unable to convince pt to not go outside during the shift to smoke. Herbalist was notified throughout the shift when pt requested to go out side to smoke.

## 2020-12-01 DIAGNOSIS — J189 Pneumonia, unspecified organism: Secondary | ICD-10-CM

## 2020-12-01 LAB — CBC
HCT: 33.8 % — ABNORMAL LOW (ref 36.0–46.0)
Hemoglobin: 11.4 g/dL — ABNORMAL LOW (ref 12.0–15.0)
MCH: 28.6 pg (ref 26.0–34.0)
MCHC: 33.7 g/dL (ref 30.0–36.0)
MCV: 84.7 fL (ref 80.0–100.0)
Platelets: 158 10*3/uL (ref 150–400)
RBC: 3.99 MIL/uL (ref 3.87–5.11)
RDW: 12.8 % (ref 11.5–15.5)
WBC: 12.6 10*3/uL — ABNORMAL HIGH (ref 4.0–10.5)
nRBC: 0 % (ref 0.0–0.2)

## 2020-12-01 LAB — BASIC METABOLIC PANEL
Anion gap: 8 (ref 5–15)
BUN: 14 mg/dL (ref 6–20)
CO2: 23 mmol/L (ref 22–32)
Calcium: 8.6 mg/dL — ABNORMAL LOW (ref 8.9–10.3)
Chloride: 106 mmol/L (ref 98–111)
Creatinine, Ser: 0.7 mg/dL (ref 0.44–1.00)
GFR, Estimated: >60 mL/min (ref >60)
Glucose, Bld: 123 mg/dL — ABNORMAL HIGH (ref 70–99)
Potassium: 3.9 mmol/L (ref 3.5–5.1)
Sodium: 137 mmol/L (ref 135–145)

## 2020-12-01 LAB — PHOSPHORUS: Phosphorus: 2.9 mg/dL (ref 2.5–4.6)

## 2020-12-01 LAB — MAGNESIUM: Magnesium: 1.5 mg/dL — ABNORMAL LOW (ref 1.7–2.4)

## 2020-12-01 MED ORDER — ONDANSETRON HCL 4 MG PO TABS: 4.0000 mg | ORAL_TABLET | Freq: Every day | ORAL | 1 refills | Status: AC | PRN

## 2020-12-01 MED ORDER — DIBUCAINE (PERIANAL) 1 % EX OINT: 1.0000 "application " | TOPICAL_OINTMENT | CUTANEOUS | Status: DC | PRN

## 2020-12-01 MED ORDER — AMOXICILLIN-POT CLAVULANATE 875-125 MG PO TABS: 1.0000 | ORAL_TABLET | Freq: Two times a day (BID) | ORAL | 0 refills | Status: AC

## 2020-12-01 MED ORDER — ACETAMINOPHEN 325 MG PO TABS: 650.0000 mg | ORAL_TABLET | ORAL | Status: DC | PRN

## 2020-12-01 MED ORDER — SIMETHICONE 80 MG PO CHEW: 80.0000 mg | CHEWABLE_TABLET | ORAL | 0 refills | Status: DC | PRN

## 2020-12-01 MED ORDER — SENNOSIDES-DOCUSATE SODIUM 8.6-50 MG PO TABS: 2.0000 | ORAL_TABLET | ORAL | Status: DC

## 2020-12-01 MED ORDER — FLUCONAZOLE 50 MG PO TABS
150.0000 mg | ORAL_TABLET | Freq: Once | ORAL | Status: AC
  Administered 2020-12-01: 150 mg via ORAL
  Filled 2020-12-01: qty 1

## 2020-12-01 MED ORDER — WITCH HAZEL-GLYCERIN EX PADS: 1.0000 "application " | MEDICATED_PAD | CUTANEOUS | 12 refills | Status: DC | PRN

## 2020-12-01 MED ORDER — BENZOCAINE-MENTHOL 20-0.5 % EX AERO: 1.0000 "application " | INHALATION_SPRAY | CUTANEOUS | Status: DC | PRN

## 2020-12-01 NOTE — Plan of Care (Signed)
Patient complained of sharp intermittent pain in LLL left mid back/chest. Blood work, EKG, and CXR done. Patient started on IV antibiotics by hospitalist, Dr. Margo Aye.

## 2020-12-01 NOTE — Progress Notes (Addendum)
Updated the patient via phone regarding her results.  She would like to go home tomorrow stating that she has 3 other children at home she needs to care for.  TRH will follow tomorrow.  Patient reports frequent vaginal yeast infection when on antibiotics.  1 dose po fluconazole 150 mg x 1 ordered.  *Pharmacy assisting with medications, checking for contraindications to breastfeeding.*   TRH will continue to follow.  Thank you for allowing [place] to participate in the care of this patient.

## 2020-12-01 NOTE — Progress Notes (Signed)
  Progress Note    Kelsey Serrano   WCH:[id number]  DOB: [birth year]  DOA: 11/29/2020     2 Date of Service: 12/01/2020     Subjective:  Mild pleuritic chest pain on the left.  No fever, no tachycardia, no leg swelling, no hemoptysis.  Mild yellowish sputum.  Hospital Problems Suspected left lingula lobar pneumonia Patient now mentating at baseline, taking orals.  Temp < 100 F, heart rate < 100bpm, RR < 24, SpO2 at baseline.   Stable for discharge.    Take Augmentin 875-125 mg twice daily for 4 more days, starting tonight Take with ondansetron Follow up with PCP in 1 week Return for worsening chest pain, fever, dizziness or difficulty breathing.     Objective Vital signs were reviewed and unremarkable.  Vitals:   11/30/20 2231 11/30/20 2347 12/01/20 0305 12/01/20 0801  BP:  123/72 117/60 107/68  Pulse:  85 64 66  Resp:  20 18 20   Temp:  98.4 F (36.9 C) 98.2 F (36.8 C) 98.4 F (36.9 C)  TempSrc:  Oral Oral Oral  SpO2: 99% 99% 98% 100%  Weight:      Height:       72.6 kg  Exam General appearance: Adult fmale, sitting up in bed, no acute distress, conversational     HEENT:    Skin:  Cardiac: RRR no murmurs, no LE edema, no JVD  Respiratory: Normal respiratory rate and rhythm, no rales or wheezing appreciated Abdomen:   MSK:  Neuro: Awake and alert, sitting up, EOMI, face symmetric, speech fluent, strength normal   Psych: attention normal, affect normal   Labs / Other Information My review of labs, imaging, notes and other tests is significant for normal CXR, elevated WBC, normal electrolytes.     Time spent: 15 minutes [facility] 12/01/2020, 9:21 AM

## 2020-12-01 NOTE — Clinical Social Work Maternal (Signed)
CLINICAL SOCIAL WORK MATERNAL/CHILD NOTE  Patient Details  Name: Kelsey Serrano MRN: [id number] Date of Birth: 03/30/1995  Date:  12/01/2020  Clinical Social Worker Initiating Note:  Chandan Fly Date/Time: Initiated:  12/01/20/      Child's Name:  Kelsey Serrano   Biological Parents:  Mother, Father   Need for Interpreter:  None   Reason for Referral:  Current Substance Use/Substance Use During Pregnancy     Address:  [address] [place] [address] [place] [address]    Phone number:  [phone] (home)     Additional phone number: N/A  Household Members/Support Persons (HM/SP):   Household Member/Support Person 1, Household Member/Support Person 2, Household Member/Support Person 3, Household Member/Support Person 4   HM/SP Name Relationship DOB or Age  HM/SP -1 Kelsey Serrano FOB    HM/SP -2 Kelsey Serrano daughter 4 years  HM/SP -3 Kelsey Serrano daughter 6 years  HM/SP -4 Kelsey Serrano daughter 1 year  HM/SP -5        HM/SP -6        HM/SP -7        HM/SP -8          Natural Supports (not living in the home):  Friends, Immediate Family   Professional Supports:     Employment:     Type of Work:     Education:      Homebound arranged:    Financial Resources:  Medicaid   Other Resources:  Food Stamps     Cultural/Religious Considerations Which May Impact Care:  None reported.  Strengths:  Ability to meet basic needs  , Compliance with medical plan  , Home prepared for child  , Pediatrician chosen   Psychotropic Medications:         Pediatrician:    [place]  Pediatrician List:   [place]    [place]    [place] Other ([facility] [place])  [place]    [place]    [place]      Pediatrician Fax Number:    Risk Factors/Current Problems:  Substance Use     Cognitive State:  Alert  , Able to Concentrate     Mood/Affect:  Calm     CSW Assessment:  CSW received a consult for drug  exposed newborn.  Baby and [name] positive for THC.  CSW spoke with RN Kelsey Serrano prior to meeting with [name]. Per RN, no additional concerns.   [name] reported she is feeling good post delivery. [name] stated she is ready to be home with family. [name] was alert/appropriate during assessment.   Confirmed contact information for [name]. [name] and Baby will be living with FOB and 3 other daughters (see list above) at discharge.   Explained Hospital Drug Screen/CPS Report Policy. CPS Report made to [place] CPS Worker Kelsey Serrano following assessment with [name], as required by policy. Informed Kelsey Serrano of plan for [name] and Baby to discharge today.  [name] reported she receives Food Stamps and will inform her Workers of Baby's birth. [name] also plans to apply for WIC services. [name] plans to use [facility] in [place] for Baby's Medical Care. [name] reported she has a bassinett, car seat clothing, diapers, and all other items needed for Baby. [name] reported she has reliable transportation for herself and Baby. [name] denied resource needs at this time.   [name] reported she has a history of PTSD. Denied MH medication or counseling/therapy history. [name] reported she has a   good support system and is coping well emotionally at this time. [name] denied SI, HI, or DV. [name] denied the need for mental health support resources at this time, reported she is aware of resources if needed.  CSW provided education and information sheets on PPD and SIDS. [name] verbalized understanding. CSW ecouraged [name] to reach out to her Provider with any questions or needs for support or resources, even after discharge.   [name] denied any needs or questions at this time. CSW encouraged [name] to reach out if any arise prior to discharge.   Please re consult CSW if any additional needs or concerns arise.   CSW Plan/Description:  No Further Intervention Required/No Barriers to Discharge, Sudden Infant Death Syndrome (SIDS) Education, Perinatal Mood and Anxiety Disorder  (PMADs) Education, Hospital Drug Screen Policy Information, Child Protective Service Report  , CSW Will Continue to Monitor Umbilical Cord Tissue Drug Screen Results and Make Report if Ralene Cork, LCSW 12/01/2020, 11:25 AM

## 2020-12-01 NOTE — Progress Notes (Signed)
Patient discharged with infant. Discharge instructions, prescriptions, and follow up appointments given to and reviewed with patient. Patient verbalized understanding. Will be escorted out by axillary.  °

## 2022-01-31 ENCOUNTER — Encounter
Admission: EM | Disposition: A | Discharge status: Home / Self Care | Payer: Self-pay | Source: Home / Self Care | Attending: Certified Nurse Midwife

## 2022-01-31 ENCOUNTER — Observation Stay: Payer: Medicaid Other

## 2022-01-31 ENCOUNTER — Other Ambulatory Visit: Payer: Self-pay

## 2022-01-31 ENCOUNTER — Other Ambulatory Visit: Payer: Medicaid Other | Admitting: Radiology

## 2022-01-31 ENCOUNTER — Observation Stay: Payer: Medicaid Other | Admitting: Anesthesiology

## 2022-01-31 ENCOUNTER — Encounter: Payer: Self-pay | Admitting: General Practice

## 2022-01-31 ENCOUNTER — Inpatient Hospital Stay
Admission: EM | Admit: 2022-01-31 | Discharge: 2022-02-01 | DRG: 818 | Disposition: A | Discharge status: Home / Self Care | Payer: Medicaid Other | Attending: Certified Nurse Midwife | Admitting: Certified Nurse Midwife

## 2022-01-31 DIAGNOSIS — N1330 Unspecified hydronephrosis: Secondary | ICD-10-CM | POA: Diagnosis present

## 2022-01-31 DIAGNOSIS — O2303 Infections of kidney in pregnancy, third trimester: Principal | ICD-10-CM | POA: Diagnosis present

## 2022-01-31 DIAGNOSIS — O99323 Drug use complicating pregnancy, third trimester: Secondary | ICD-10-CM | POA: Diagnosis present

## 2022-01-31 DIAGNOSIS — Z20822 Contact with and (suspected) exposure to covid-19: Secondary | ICD-10-CM | POA: Diagnosis present

## 2022-01-31 DIAGNOSIS — R1012 Left upper quadrant pain: Secondary | ICD-10-CM | POA: Diagnosis present

## 2022-01-31 DIAGNOSIS — O99891 Other specified diseases and conditions complicating pregnancy: Secondary | ICD-10-CM | POA: Diagnosis present

## 2022-01-31 DIAGNOSIS — N2889 Other specified disorders of kidney and ureter: Secondary | ICD-10-CM | POA: Diagnosis not present

## 2022-01-31 DIAGNOSIS — Z3A28 28 weeks gestation of pregnancy: Secondary | ICD-10-CM | POA: Diagnosis not present

## 2022-01-31 DIAGNOSIS — R10A2 Flank pain, left side: Secondary | ICD-10-CM | POA: Diagnosis present

## 2022-01-31 DIAGNOSIS — F1590 Other stimulant use, unspecified, uncomplicated: Secondary | ICD-10-CM | POA: Diagnosis present

## 2022-01-31 DIAGNOSIS — F17210 Nicotine dependence, cigarettes, uncomplicated: Secondary | ICD-10-CM | POA: Diagnosis present

## 2022-01-31 DIAGNOSIS — N12 Tubulo-interstitial nephritis, not specified as acute or chronic: Secondary | ICD-10-CM | POA: Diagnosis not present

## 2022-01-31 DIAGNOSIS — Z87442 Personal history of urinary calculi: Secondary | ICD-10-CM

## 2022-01-31 DIAGNOSIS — R109 Unspecified abdominal pain: Secondary | ICD-10-CM | POA: Diagnosis present

## 2022-01-31 DIAGNOSIS — O0933 Supervision of pregnancy with insufficient antenatal care, third trimester: Secondary | ICD-10-CM

## 2022-01-31 DIAGNOSIS — N139 Obstructive and reflux uropathy, unspecified: Secondary | ICD-10-CM | POA: Diagnosis present

## 2022-01-31 DIAGNOSIS — O99333 Smoking (tobacco) complicating pregnancy, third trimester: Secondary | ICD-10-CM | POA: Diagnosis present

## 2022-01-31 HISTORY — PX: CYSTOSCOPY W/ URETERAL STENT PLACEMENT: SHX1429

## 2022-01-31 LAB — URINE DRUG SCREEN, QUALITATIVE (ARMC ONLY)
Amphetamines, Ur Screen: POSITIVE — AB
Barbiturates, Ur Screen: NOT DETECTED
Benzodiazepine, Ur Scrn: NOT DETECTED
Cannabinoid 50 Ng, Ur ~~LOC~~: POSITIVE — AB
Cocaine Metabolite,Ur ~~LOC~~: NOT DETECTED
MDMA (Ecstasy)Ur Screen: NOT DETECTED
Methadone Scn, Ur: NOT DETECTED
Opiate, Ur Screen: NOT DETECTED
Phencyclidine (PCP) Ur S: NOT DETECTED
Tricyclic, Ur Screen: NOT DETECTED

## 2022-01-31 LAB — URINALYSIS, COMPLETE (UACMP) WITH MICROSCOPIC
Bilirubin Urine: NEGATIVE
Glucose, UA: NEGATIVE mg/dL
Ketones, ur: NEGATIVE mg/dL
Leukocytes,Ua: NEGATIVE
Nitrite: NEGATIVE
Protein, ur: NEGATIVE mg/dL
Specific Gravity, Urine: 1.015 (ref 1.005–1.030)
pH: 7 (ref 5.0–8.0)

## 2022-01-31 LAB — COMPREHENSIVE METABOLIC PANEL
ALT: 15 U/L (ref 0–44)
AST: 18 U/L (ref 15–41)
Albumin: 3.1 g/dL — ABNORMAL LOW (ref 3.5–5.0)
Alkaline Phosphatase: 118 U/L (ref 38–126)
Anion gap: 6 (ref 5–15)
BUN: 11 mg/dL (ref 6–20)
CO2: 25 mmol/L (ref 22–32)
Calcium: 8.8 mg/dL — ABNORMAL LOW (ref 8.9–10.3)
Chloride: 107 mmol/L (ref 98–111)
Creatinine, Ser: 0.59 mg/dL (ref 0.44–1.00)
GFR, Estimated: >60 mL/min (ref >60)
Glucose, Bld: 116 mg/dL — ABNORMAL HIGH (ref 70–99)
Potassium: 3.9 mmol/L (ref 3.5–5.1)
Sodium: 138 mmol/L (ref 135–145)
Total Bilirubin: 0.5 mg/dL (ref 0.3–1.2)
Total Protein: 6.7 g/dL (ref 6.5–8.1)

## 2022-01-31 LAB — TYPE AND SCREEN
ABO/RH(D): O POS
Antibody Screen: NEGATIVE

## 2022-01-31 LAB — CBC
HCT: 38.7 % (ref 36.0–46.0)
Hemoglobin: 13.1 g/dL (ref 12.0–15.0)
MCH: 30 pg (ref 26.0–34.0)
MCHC: 33.9 g/dL (ref 30.0–36.0)
MCV: 88.8 fL (ref 80.0–100.0)
Platelets: 176 10*3/uL (ref 150–400)
RBC: 4.36 MIL/uL (ref 3.87–5.11)
RDW: 12.9 % (ref 11.5–15.5)
WBC: 17.1 10*3/uL — ABNORMAL HIGH (ref 4.0–10.5)
nRBC: 0 % (ref 0.0–0.2)

## 2022-01-31 LAB — HEPATITIS B SURFACE ANTIGEN: Hepatitis B Surface Ag: NONREACTIVE

## 2022-01-31 LAB — RAPID HIV SCREEN (HIV 1/2 AB+AG)
HIV 1/2 Antibodies: NONREACTIVE
HIV-1 P24 Antigen - HIV24: NONREACTIVE

## 2022-01-31 LAB — PROTEIN / CREATININE RATIO, URINE
Creatinine, Urine: 63 mg/dL
Protein Creatinine Ratio: 0.17 mg/mg{Cre} — ABNORMAL HIGH (ref 0.00–0.15)
Total Protein, Urine: 11 mg/dL

## 2022-01-31 SURGERY — CYSTOSCOPY, WITH RETROGRADE PYELOGRAM AND URETERAL STENT INSERTION
Anesthesia: General | Site: Ureter | Laterality: Left

## 2022-01-31 MED ORDER — ONDANSETRON HCL 4 MG/2ML IJ SOLN
INTRAMUSCULAR | Status: DC | PRN
  Administered 2022-01-31: 4 mg via INTRAVENOUS

## 2022-01-31 MED ORDER — SODIUM CHLORIDE 0.9 % IV SOLN: INTRAVENOUS | Status: DC

## 2022-01-31 MED ORDER — PROPOFOL 10 MG/ML IV BOLUS
INTRAVENOUS | Status: DC | PRN
  Administered 2022-01-31: 150 mg via INTRAVENOUS

## 2022-01-31 MED ORDER — OXYCODONE HCL 5 MG PO TABS: 5.0000 mg | ORAL_TABLET | Freq: Once | ORAL | Status: DC | PRN

## 2022-01-31 MED ORDER — MORPHINE SULFATE (PF) 2 MG/ML IV SOLN
2.0000 mg | INTRAVENOUS | Status: AC | PRN
  Administered 2022-01-31: 2 mg via INTRAVENOUS
  Filled 2022-01-31: qty 1

## 2022-01-31 MED ORDER — OXYCODONE HCL 5 MG/5ML PO SOLN: 5.0000 mg | Freq: Once | ORAL | Status: DC | PRN

## 2022-01-31 MED ORDER — MORPHINE SULFATE (PF) 4 MG/ML IV SOLN
4.0000 mg | Freq: Once | INTRAVENOUS | Status: AC
  Administered 2022-01-31: 4 mg via INTRAVENOUS
  Filled 2022-01-31: qty 1

## 2022-01-31 MED ORDER — MORPHINE SULFATE (PF) 2 MG/ML IV SOLN
2.0000 mg | INTRAVENOUS | Status: AC | PRN
  Administered 2022-01-31 (×2): 2 mg via INTRAVENOUS
  Filled 2022-01-31 (×2): qty 1

## 2022-01-31 MED ORDER — FENTANYL CITRATE (PF) 100 MCG/2ML IJ SOLN
INTRAMUSCULAR | Status: AC
  Filled 2022-01-31: qty 2

## 2022-01-31 MED ORDER — ZOLPIDEM TARTRATE 5 MG PO TABS: 5.0000 mg | ORAL_TABLET | Freq: Every evening | ORAL | Status: DC | PRN

## 2022-01-31 MED ORDER — LACTATED RINGERS IV BOLUS
500.0000 mL | Freq: Once | INTRAVENOUS | Status: AC
  Administered 2022-01-31: 500 mL via INTRAVENOUS

## 2022-01-31 MED ORDER — SODIUM CHLORIDE 0.9 % IV SOLN
1.0000 g | INTRAVENOUS | Status: DC
  Administered 2022-01-31 – 2022-02-01 (×2): 1 g via INTRAVENOUS
  Filled 2022-01-31 (×2): qty 10

## 2022-01-31 MED ORDER — PROPOFOL 10 MG/ML IV BOLUS
INTRAVENOUS | Status: AC
  Filled 2022-01-31: qty 40

## 2022-01-31 MED ORDER — FENTANYL CITRATE (PF) 100 MCG/2ML IJ SOLN
INTRAMUSCULAR | Status: DC | PRN
  Administered 2022-01-31 (×2): 50 ug via INTRAVENOUS

## 2022-01-31 MED ORDER — PRENATAL MULTIVITAMIN CH: 1.0000 | ORAL_TABLET | Freq: Every day | ORAL | Status: DC

## 2022-01-31 MED ORDER — LACTATED RINGERS IV SOLN: INTRAVENOUS | Status: DC

## 2022-01-31 MED ORDER — SODIUM CHLORIDE 0.9 % IR SOLN
Status: DC | PRN
  Administered 2022-01-31: 3000 mL via INTRAVESICAL

## 2022-01-31 MED ORDER — LACTATED RINGERS IV SOLN: 125.0000 mL/h | INTRAVENOUS | Status: AC

## 2022-01-31 MED ORDER — ACETAMINOPHEN 325 MG PO TABS
650.0000 mg | ORAL_TABLET | ORAL | Status: DC | PRN
  Administered 2022-01-31 – 2022-02-01 (×5): 650 mg via ORAL
  Filled 2022-01-31 (×5): qty 2

## 2022-01-31 MED ORDER — PHENYLEPHRINE HCL (PRESSORS) 10 MG/ML IV SOLN
INTRAVENOUS | Status: DC | PRN
  Administered 2022-01-31 (×3): 160 ug via INTRAVENOUS

## 2022-01-31 MED ORDER — CALCIUM CARBONATE ANTACID 500 MG PO CHEW: 2.0000 | CHEWABLE_TABLET | ORAL | Status: DC | PRN

## 2022-01-31 MED ORDER — DOCUSATE SODIUM 100 MG PO CAPS
100.0000 mg | ORAL_CAPSULE | Freq: Every day | ORAL | Status: DC
  Administered 2022-01-31 – 2022-02-01 (×2): 100 mg via ORAL
  Filled 2022-01-31 (×2): qty 1

## 2022-01-31 MED ORDER — DEXMEDETOMIDINE HCL IN NACL 80 MCG/20ML IV SOLN
INTRAVENOUS | Status: DC | PRN
  Administered 2022-01-31: 8 ug via BUCCAL

## 2022-01-31 MED ORDER — FENTANYL CITRATE (PF) 100 MCG/2ML IJ SOLN: 25.0000 ug | INTRAMUSCULAR | Status: DC | PRN

## 2022-01-31 MED ORDER — ONDANSETRON HCL 4 MG/2ML IJ SOLN
4.0000 mg | Freq: Four times a day (QID) | INTRAMUSCULAR | Status: DC | PRN
  Administered 2022-01-31: 4 mg via INTRAVENOUS
  Filled 2022-01-31: qty 2

## 2022-01-31 MED ORDER — PRENATAL MULTIVITAMIN CH
1.0000 | ORAL_TABLET | Freq: Every day | ORAL | Status: DC
  Administered 2022-02-01: 1 via ORAL
  Filled 2022-01-31: qty 1

## 2022-01-31 MED ORDER — SUCCINYLCHOLINE CHLORIDE 200 MG/10ML IV SOSY
PREFILLED_SYRINGE | INTRAVENOUS | Status: DC | PRN
  Administered 2022-01-31: 140 mg via INTRAVENOUS

## 2022-01-31 SURGICAL SUPPLY — 21 items
BAG DRAIN SIEMENS DORNER NS (MISCELLANEOUS) ×1 IMPLANT
BRUSH SCRUB EZ 1% IODOPHOR (MISCELLANEOUS) ×1 IMPLANT
CATH URETL OPEN 5X70 (CATHETERS) ×1 IMPLANT
CONRAY 43 FOR UROLOGY 50M (MISCELLANEOUS) ×1 IMPLANT
DRAPE UTILITY 15X26 TOWEL STRL (DRAPES) ×1 IMPLANT
GAUZE 4X4 16PLY ~~LOC~~+RFID DBL (SPONGE) ×2 IMPLANT
GLOVE BIO SURGEON STRL SZ 6.5 (GLOVE) ×1 IMPLANT
GOWN STRL REUS W/ TWL XL LVL3 (GOWN DISPOSABLE) ×1 IMPLANT
GOWN STRL REUS W/TWL XL LVL3 (GOWN DISPOSABLE) ×1
GUIDEWIRE STR DUAL SENSOR (WIRE) ×1 IMPLANT
IV NS IRRIG 3000ML ARTHROMATIC (IV SOLUTION) ×1 IMPLANT
KIT TURNOVER CYSTO (KITS) ×1 IMPLANT
MANIFOLD NEPTUNE II (INSTRUMENTS) ×1 IMPLANT
PACK CYSTO AR (MISCELLANEOUS) ×1 IMPLANT
SET CYSTO W/LG BORE CLAMP LF (SET/KITS/TRAYS/PACK) ×1 IMPLANT
STENT URET 6FRX24 CONTOUR (STENTS) ×1 IMPLANT
STENT URET 6FRX26 CONTOUR (STENTS) ×1 IMPLANT
SURGILUBE 2OZ TUBE FLIPTOP (MISCELLANEOUS) ×1 IMPLANT
TRAP FLUID SMOKE EVACUATOR (MISCELLANEOUS) ×1 IMPLANT
WATER STERILE IRR 1000ML POUR (IV SOLUTION) ×1 IMPLANT
WATER STERILE IRR 500ML POUR (IV SOLUTION) ×1 IMPLANT

## 2022-01-31 NOTE — Progress Notes (Signed)
Patient ID: Kelsey Serrano, female   DOB: 10-May-1995, 26 y.o.   MRN: [id number] Upto date with this admission by CNM  Severe left flank pain and renal u/s with hydronephrosis and probable urolithiasis + UDS amphetamines and THC   No PNC  28+ weeks EGA  Cat 1 fetal monitoring   Urology consult to see and probable OR eval . Pt is NPO

## 2022-01-31 NOTE — Progress Notes (Addendum)
RN to monitor fetus during OR and PACU recovery. See chart for details. Copy of fetal strip to be scanned into chart.

## 2022-01-31 NOTE — Anesthesia Postprocedure Evaluation (Signed)
Anesthesia Post Note  Patient: Kelsey Serrano  Procedure(s) Performed: CYSTOSCOPY WITH RETROGRADE PYELOGRAM/URETERAL STENT PLACEMENT (Left: Ureter)  Patient location during evaluation: PACU Anesthesia Type: General Level of consciousness: awake and alert Pain management: pain level controlled Vital Signs Assessment: post-procedure vital signs reviewed and stable Respiratory status: spontaneous breathing, nonlabored ventilation, respiratory function stable and patient connected to nasal cannula oxygen Cardiovascular status: blood pressure returned to baseline and stable Postop Assessment: no apparent nausea or vomiting Anesthetic complications: no   No notable events documented.   Last Vitals:  Vitals:   01/31/22 1600 01/31/22 1610  BP: 118/70 122/62  Pulse: 89 93  Resp: 15 17  Temp: 36.7 C   SpO2: 96% 96%    Last Pain:  Vitals:   01/31/22 1610  TempSrc:   PainSc: 0-No pain                 Corinda Gubler

## 2022-01-31 NOTE — OB Triage Note (Signed)
Patient coming in for back left pain that radiates to her abdomen with a 10/10 pain score. Patient states pain began overnight, and has had 3-4 emesis episodes, one episode while on unit. Patient denies headache or blurry vision, and confirms fetal movement. Patient states her LMP was on Mother's Day (07/14/2021), putting her EDD at 04/20/22. Based off of this LMP patient is 28wk5d.   Bedside US completed by R. McVey, CNM. Unable to fully answer triage questions due to patient refusal, Provider aware.

## 2022-01-31 NOTE — Progress Notes (Signed)
Reviewed Renal US results with Dr Feliberto Gottron, pain mgmt orders updated.  Urology consult placed.   Randa Ngo, CNM

## 2022-01-31 NOTE — Consult Note (Signed)
Urology Consult  I have been asked to see the patient by Dr. Ouida Sills, for evaluation and management of left hydronephrosis.  Chief Complaint: Left flank pain  History of Present Illness: Kelsey Serrano is a 26 y.o. year old [redacted]w[redacted]d gravid female with no prenatal care who is admitted for intractable left-sided flank pain associated with hydronephrosis and no ureteral jets seen on renal ultrasound likely secondary to an obstructing ureteral stone.  She is a difficult historian as she does not answer questions readily as she is writhing in bed moaning and shaking secondary to the pain.  When questioned about her past medical history, she just screams look at my chart.    She was seen by Dr. Erlene Quan in 2016 for spontaneous passage of a left UVJ stone and for right ureteroscopy for a 1 centimeter UPJ stone and a 1 cm proximal ureteral stone.  She did not follow-up in our office for renal ultrasound as scheduled but was seen in the ED for UTI renal ultrasound was performed and noted no hydronephrosis.    There is a note in the chart (09/15/2014) stating that she wanted her PCP to refer her to [facility] urology, but I do not see where she has been seen by them.  VSS afebrile   This morning labs noted serum creatinine 0.59, WBC count of 17.1, urine drug screen was positive for amphetamines and cannabinoids, urinalysis with micro heme and urine cultures pending.   Past Medical History:  Diagnosis Date   Abuse, drug or alcohol ([place]) 11/07/2010   Overview:  A.  Never IVDA.   prescription drugs, THC, methamphetamines.   B.   Denies any since March 2013.  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  No prior ED visits or hospitalization    Asthma    Calculus of kidney 10/25/2014   Chronic cough 01/01/2012   Overview:  A.  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  Depression, PTSA, mood disorder NOS B.  Psychiatrist:  Helene Kelp C.  Therapist:  Marjie Skiff    Renal colic [id number]   Renal disorder    Restless leg syndrome     Past Surgical History:  Procedure Laterality Date   COLONOSCOPY WITH PROPOFOL N/A 12/04/2014   Procedure: COLONOSCOPY WITH PROPOFOL;  Surgeon: Lucilla Lame, MD;  Location: [facility];  Service: Endoscopy;  Laterality: N/A;   DENTAL SURGERY     ESOPHAGOGASTRODUODENOSCOPY (EGD) WITH PROPOFOL N/A 12/04/2014   Procedure: ESOPHAGOGASTRODUODENOSCOPY (EGD) WITH PROPOFOL;  Surgeon: Lucilla Lame, MD;  Location: [facility];  Service: Endoscopy;  Laterality: N/A;   kidney stent     kidney stones Left    LITHOTRIPSY      Home Medications:    Allergies:  Allergies  Allergen Reactions   Augmentin [Amoxicillin-Pot Clavulanate] Nausea And Vomiting   Estrogens Nausea And Vomiting    Pt states she is allergic to high doses of estrogens.   Lavender Oil Hives   Zyrtec [Cetirizine] Nausea And Vomiting    Family History  Problem Relation  Age of Onset   Diabetes Mother     Social History:  reports that she has been smoking cigarettes. She has a 7.00 pack-year smoking history. She has never used smokeless tobacco. She reports current drug use. Drugs: Marijuana and Amphetamines. She reports that she does not drink alcohol.  ROS: A complete review of systems was performed.  All systems are negative except for pertinent findings as noted.  Physical Exam:  Vital signs in last 24 hours: Temp:  [98.2 F (36.8 C)] 98.2 F (36.8 C) (12/01 0706) Pulse Rate:  [95-122] 122 (12/01 0706) Resp:  [16] 16 (12/01 0706) BP: (139-149)/(73-103) 139/73 (12/01 0706) SpO2:  [99 %-100  %] 99 % (12/01 0800) Weight:  [69.4 kg] 69.4 kg (12/01 0800) Constitutional: Writhing in bed with pain  HEENT: Poor dentition.  Trachea midline Cardiovascular: No clubbing, cyanosis, or edema. Respiratory: Normal respiratory effort GI: Gravid GU: Left CVA tenderness Skin: No rashes, bruises or suspicious lesions Neurologic: Grossly intact, no focal deficits, moving all 4 extremities Psychiatric: Not cooperative and belligerent   Laboratory Data:  Recent Labs    01/31/22 0536  WBC 17.1*  HGB 13.1  HCT 38.7   Recent Labs    01/31/22 0536  NA 138  K 3.9  CL 107  CO2 25  GLUCOSE 116*  BUN 11  CREATININE 0.59  CALCIUM 8.8*   No results for input(s): "LABPT", "INR" in the last 72 hours. No results for input(s): "LABURIN" in the last 72 hours. Results for orders placed or performed during the hospital encounter of 11/29/20  Resp Panel by RT-PCR (Flu A&B, Covid) Nasopharyngeal Swab     Status: None   Collection Time: 11/29/20 10:16 AM   Specimen: Nasopharyngeal Swab; Nasopharyngeal(NP) swabs in vial transport medium  Result Value Ref Range Status   SARS Coronavirus 2 by RT PCR NEGATIVE NEGATIVE Final    Comment: (NOTE) SARS-CoV-2 target nucleic acids are NOT DETECTED.  The SARS-CoV-2 RNA is generally detectable in upper respiratory specimens during the acute phase of infection. The lowest concentration of SARS-CoV-2 viral copies this assay can detect is 138 copies/mL. A negative result does not preclude SARS-Cov-2 infection and should not be used as the sole basis for treatment or other patient management decisions. A negative result may occur with  improper specimen collection/handling, submission of specimen other than nasopharyngeal swab, presence of viral mutation(s) within the areas targeted by this assay, and inadequate number of viral copies(<138 copies/mL). A negative result must be combined with clinical observations, patient history, and  epidemiological information. The expected result is Negative.  Fact Sheet for Patients:  BloggerCourse.com  Fact Sheet for Healthcare Providers:  SeriousBroker.it  This test is no t yet approved or cleared by the [place] FDA and  has been authorized for detection and/or diagnosis of SARS-CoV-2 by FDA under an Emergency Use Authorization (EUA). This EUA will remain  in effect (meaning this test can be used) for the duration of the COVID-19 declaration under Section 564(b)(1) of the Act, 21 U.S.C.section 360bbb-3(b)(1), unless the authorization is terminated  or revoked sooner.       Influenza A by PCR NEGATIVE NEGATIVE Final   Influenza B by PCR NEGATIVE NEGATIVE Final    Comment: (NOTE) The Xpert Xpress SARS-CoV-2/FLU/RSV plus assay is intended as an aid in the diagnosis of influenza from Nasopharyngeal swab specimens and should not be used as a sole basis for treatment. Nasal washings and aspirates are unacceptable for Xpert Xpress SARS-CoV-2/FLU/RSV testing.  Fact  Sheet for Patients: EntrepreneurPulse.com.au  Fact Sheet for Healthcare Providers: IncredibleEmployment.be  This test is not yet approved or cleared by the [place] FDA and has been authorized for detection and/or diagnosis of SARS-CoV-2 by FDA under an Emergency Use Authorization (EUA). This EUA will remain in effect (meaning this test can be used) for the duration of the COVID-19 declaration under Section 564(b)(1) of the Act, 21 U.S.C. section 360bbb-3(b)(1), unless the authorization is terminated or revoked.  Performed at [facility], [address], [place],  [address]      Radiologic Imaging: [place] [employer]  Result Date: 01/31/2022 CLINICAL DATA:  Left flank pain.  No prenatal care. EXAM: LIMITED OBSTETRIC ULTRASOUND COMPARISON:  None similar FINDINGS: Number of Fetuses: 1 Heart Rate:   144 bpm Movement: Yes per sonographer exam Presentation: Cephalic Placental Location: Fundal and left lateral Previa: Not seen Amniotic Fluid (Subjective):  Within normal limits. AFI: 10 cm BPD: 7.7 cm 30 w  5 d MATERNAL FINDINGS: Cervix:  Appears closed with 4 cm length. Uterus/Adnexae: No abnormality visualized. IMPRESSION: 1. Single living intrauterine pregnancy measuring 30 weeks 5 days. 2. No unexpected finding. 3. This exam is performed on an emergent basis and does not comprehensively evaluate fetal size, dating, or anatomy. Electronically Signed   By: Jorje Guild M.D.   On: 01/31/2022 07:28   US RENAL  Result Date: 01/31/2022 CLINICAL DATA:  Left flank pain.  Third trimester pregnancy EXAM: RENAL / URINARY TRACT ULTRASOUND COMPLETE COMPARISON:  Renal ultrasound 09/08/2014 FINDINGS: Right Kidney: Renal measurements: 11 x 5 x 6 cm = volume: 170 mL. Mild hydronephrosis. Left Kidney: Renal measurements: 13 x 7 x 6 cm = volume: 260 mL. Moderate hydronephrosis with perinephric fluid. Bidirectional color Doppler flow seen at the hilum. Bladder: Appears normal for degree of bladder distention. Ureteral jets were not seen on either side during 3 minutes of visualization. IMPRESSION: 1. Moderate left hydronephrosis with perinephric stranding and relative nephromegaly implying urinary obstruction. Ureteral jets were not seen on either side during 3 minutes of surveillance. 2. Mild right hydronephrosis. Electronically Signed   By: Jorje Guild M.D.   On: 01/31/2022 07:18    Impression/Assessment:  26 year old 28-week 5-day gravid female with a history of nephrolithiasis and polysubstance abuse who presented with 10 out of 10 left flank pain.  Renal ultrasound demonstrated moderate left hydronephrosis with perinephric stranding and relative nephromegaly and mild right hydronephrosis with no ureteral jets bilaterally.  Plan:  -Recommendation for the placement of left-sided PERC tube versus ureteral stent  as there is a high concern for the patient being lost to follow-up -Explained the procedure and the risk to the patient and she is wanting to proceed ASAP -She will need follow-up after she delivers for definitive treatment for the hydronephrosis as it is likely due to nephrolithiasis -Please make patient n.p.o.  01/31/2022, 9:32 AM  Alice Burnside, PA-C

## 2022-01-31 NOTE — Op Note (Signed)
Date of procedure: 01/31/22  Preoperative diagnosis:  Left pyelonephritis Left hydronephrosis  Postoperative diagnosis:  Same as above  Procedure: Cystoscopy Ultrasound guided placement of indwelling double-J ureteral stent on left  Surgeon: [name], MD  Anesthesia: General  Complications: None  Intraoperative findings: Bladder erythema, nonspecific consistent with cystitis.  Purulent discharge and debris with manipulation of the left ureter.  Wire advanced easily into the renal pelvis seen on renal ultrasound.  Stent placed without difficulty.  EBL: Minimal  Specimens: None  Drains: 6 x 24 French double-J ureteral stent on left  Indication: Kelsey Serrano is a 26 y.o. patient with evidence of severe pyelonephritis with probable obstruction on the left presumably from a kidney stone given her history and clinical and radiologic findings..  After reviewing the management options for treatment, she elected to proceed with the above surgical procedure(s). We have discussed the potential benefits and risks of the procedure, side effects of the proposed treatment, the likelihood of the patient achieving the goals of the procedure, and any potential problems that might occur during the procedure or recuperation. Informed consent has been obtained.  Description of procedure:  The patient was taken to the operating room and general anesthesia was induced.  The patient was placed in the dorsal lithotomy position, prepped and draped in the usual sterile fashion, and preoperative antibiotics were administered. A preoperative time-out was performed.   Notably, fetal monitoring was performed throughout the entirety of the procedure.  First, 21 French cystoscope was advanced per urethra into the bladder.  The bladder was inspected which was mildly cloudy with diffuse patchy erythema consistent with cystitis.  Attention was turned to the left ureteral orifice.  Bedside renal ultrasound  was performed with assistance from the radiology tech.  The collecting system was noted to be dilated.  I was able to advance the wire all the way up to the renal pelvis which could be seen traversing the proximal ureter into the renal pelvis on ultrasound.  Upon doing so, there was initially purulent drainage and then scant debris during manipulation consistent with a relief of the obstruction.  I then advanced a 6 x 24 French double-J ureteral stent over the wire which advanced easily.  With the wire was removed, we did see the stent itself in the proximal ureter and collecting system although was not able to clearly identify a full coil.  There was however a full coil in the bladder.  Given the E flux and hydronephrotic drip from the distal ureter, I did feel that the stent positioning was likely adequate.  I elected not to proceed with additional radiographic images in order to minimize x-ray exposure.  Bladder was then drained.  She was then cleaned and dried, repositioned in supine position, reversed of anesthesia and taken the PACU in stable condition.  Plan: Continue supportive care and IV antibiotics.  Transition to oral antibiotics based on culture and sensitivity data, to be treated for at least 14 days in setting of complicated UTI and obstruction.  She has follow-up with me in mid December to discuss definitive management.  Will likely wait until after she delivers to proceed with this.  She understands.  She also understands the importance of outpatient follow-up and complications related to noncompliance and retained ureteral stent.   [name], M.D.

## 2022-01-31 NOTE — Transfer of Care (Signed)
Immediate Anesthesia Transfer of Care Note  Patient: Kelsey Serrano  Procedure(s) Performed: CYSTOSCOPY WITH RETROGRADE PYELOGRAM/URETERAL STENT PLACEMENT (Left: Ureter)  Patient Location: PACU  Anesthesia Type:General  Level of Consciousness: drowsy  Airway & Oxygen Therapy: Patient Spontanous Breathing  Post-op Assessment: Report given to RN  Post vital signs: stable  Last Vitals:  Vitals Value Taken Time  BP 130/62 01/31/22 1523  Temp    Pulse 106 01/31/22 1526  Resp 22 01/31/22 1526  SpO2 96 % 01/31/22 1526  Vitals shown include unvalidated device data.  Last Pain:  Vitals:   01/31/22 1412  TempSrc: Tympanic  PainSc: 9          Complications: No notable events documented.

## 2022-01-31 NOTE — Progress Notes (Signed)
Attempted to obtain triage information from patient upon initial encounter. Difficulty obtaining information due to patient's refusal and pain. Midwife on call is aware.

## 2022-01-31 NOTE — Interval H&P Note (Signed)
History and Physical Interval Note:  01/31/2022 2:49 PM  Kelsey Serrano  has presented today for surgery, with the diagnosis of left ureteral calculus.  The various methods of treatment have been discussed with the patient and family. After consideration of risks, benefits and other options for treatment, the patient has consented to  Procedure(s): CYSTOSCOPY WITH RETROGRADE PYELOGRAM/URETERAL STENT PLACEMENT (Left) as a surgical intervention.  The patient's history has been reviewed, patient examined, no change in status, stable for surgery.  I have reviewed the patient's chart and labs.  Questions were answered to the patient's satisfaction.    RRR CTAB  We reviewed the risk of stent again in the preoperative holding area including the risk of stent pain, blood in the urine, worsening of her urinary sepsis, intolerance, need for further procedures, preterm labor and or pregnancy loss amongst others.  She understands the risks of retained stent and we specifically discussed this in detail today.  She understands the risk of loss of renal unit, recurrent infections, cancer amongst others.  She agrees to return to have definitive management for her stone and stent removal.  She did verbalize these concerns back to me voicing understanding.   [name]

## 2022-01-31 NOTE — Anesthesia Preprocedure Evaluation (Signed)
Anesthesia Evaluation  Patient identified by MRN, date of birth, ID band Patient awake    Reviewed: Allergy & Precautions, NPO status , Patient's Chart, lab work & pertinent test results  History of Anesthesia Complications (+) history of anesthetic complications  Airway Mallampati: III  TM Distance: >3 FB Neck ROM: full    Dental  (+) Chipped, Poor Dentition, Missing   Pulmonary asthma , Current Smoker and Patient abstained from smoking.   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  Never IVDA.   prescription drugs, THC,               methamphetamines.   B.   Denies any since March 2013.  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  No prior ED visits or hospitalization  No date: Asthma 10/25/2014: Calculus of kidney 01/01/2012: Chronic cough     Comment:  Overview:  A.  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  Depression, PTSA, mood disorder NOS B.                 Psychiatrist:  Mathews Argyle C.  Therapist:  Elita Quick  10/25/2014: Renal colic No date: Renal disorder No date: Restless leg syndrome  Past Surgical History: 12/04/2014: COLONOSCOPY WITH PROPOFOL; N/A     Comment:  Procedure: COLONOSCOPY WITH PROPOFOL;  Surgeon: Midge Minium, MD;  Location: [facility];  Service:               Endoscopy;  Laterality: N/A; No date: DENTAL SURGERY 12/04/2014: ESOPHAGOGASTRODUODENOSCOPY (EGD) WITH PROPOFOL; N/A     Comment:  Procedure: ESOPHAGOGASTRODUODENOSCOPY (EGD) WITH               PROPOFOL;  Surgeon: Midge Minium, MD;  Location: [facility]GERY [facility];  Service: Endoscopy;  Laterality: N/A; No date: kidney stent No date: kidney stones; Left No date: LITHOTRIPSY  BMI    Body Mass Index: 27.98 kg/m      Reproductive/Obstetrics negative OB ROS  Anesthesia Physical Anesthesia Plan  ASA: 3 and emergent  Anesthesia Plan: General ETT   Post-op Pain Management:    Induction: Intravenous  PONV Risk Score and Plan: Ondansetron, Dexamethasone, Midazolam and Treatment may vary due to age or medical condition  Airway Management Planned: Oral ETT  Additional Equipment:   Intra-op Plan:   Post-operative Plan: Extubation in OR  Informed Consent: I have reviewed the patients History and Physical, chart, labs and discussed the procedure including the risks, benefits and alternatives for the proposed anesthesia with the patient or authorized representative who has indicated his/her understanding and acceptance.     Dental Advisory Given  Plan Discussed with: Anesthesiologist, CRNA and Surgeon  Anesthesia Plan Comments: (I confirmed with Dr. Apolinar Junes that she would like to proceed with this case emergently. Plan for intra operative fetal monitoring. Patient consented for risk to fetus from anesthesia including but not limited to harm to fetus, birth defects, pre  term delivery and or death.  Patient voiced assent.  Patient consented for risks of anesthesia including but not limited to:  - adverse reactions to medications - damage to eyes, teeth, lips or other oral mucosa - nerve damage due to positioning  - sore throat or hoarseness - Damage to heart, brain, nerves, lungs, other parts of body or loss of life  Patient voiced understanding.)       Anesthesia Quick Evaluation

## 2022-01-31 NOTE — Progress Notes (Signed)
FACULTY PRACTICE ANTEPARTUM COMPREHENSIVE PROGRESS NOTE  Kelsey Serrano is a 26 y.o. [id number] at [redacted]w[redacted]d who is admitted for urinary obstruction and UTI.  Estimated Date of Delivery: 04/20/22.  Length of Stay:  0 Days. Admitted 01/31/2022  Subjective: She is comfortable after her procedure.   Vitals:  Blood pressure 121/71, pulse 88, temperature 98.5 F (36.9 C), temperature source Oral, resp. rate 18, height 5\' 2"  (1.575 m), weight 69.4 kg, last menstrual period 07/14/2021, SpO2 100 %, unknown if currently breastfeeding. Physical Examination: CONSTITUTIONAL: Well-developed, well-nourished female in no acute distress.  HENT:  Normocephalic, atraumatic, External right and left ear normal. Oropharynx is clear and moist EYES: Conjunctivae and EOM are normal. Pupils are equal, round, and reactive to light. No scleral icterus.  NECK: Normal range of motion, supple, no masses SKIN: Skin is warm and dry. No rash noted. Not diaphoretic. No erythema. No pallor. NEUROLGIC: Alert and oriented to person, place, and time. Normal reflexes, muscle tone coordination. No cranial nerve deficit noted. PSYCHIATRIC: Sleepy but appropriate. CARDIOVASCULAR: Normal heart rate noted, regular rhythm RESPIRATORY: Effort and breath sounds normal, no problems with respiration noted MUSCULOSKELETAL: Normal range of motion. No edema and no tenderness. 2+ distal pulses. ABDOMEN: Soft, nontender, nondistended, gravid. CERVIX:deferred  Fetal monitoring: FHR: 135 bpm, Variability: moderate, Accelerations: Present, Decelerations: Absent  Uterine activity: No contractions per hour  Results for orders placed or performed during the hospital encounter of 01/31/22 (from the past 48 hour(s))  Urinalysis, Complete w Microscopic Urine, Clean Catch     Status: Abnormal   Collection Time: 01/31/22  5:10 AM  Result Value Ref Range   Color, Urine YELLOW (A) YELLOW   APPearance HAZY (A) CLEAR   Specific Gravity, Urine 1.015 1.005 -  1.030   pH 7.0 5.0 - 8.0   Glucose, UA NEGATIVE NEGATIVE mg/dL   Hgb urine dipstick SMALL (A) NEGATIVE   Bilirubin Urine NEGATIVE NEGATIVE   Ketones, ur NEGATIVE NEGATIVE mg/dL   Protein, ur NEGATIVE NEGATIVE mg/dL   Nitrite NEGATIVE NEGATIVE   Leukocytes,Ua NEGATIVE NEGATIVE   RBC / HPF 6-10 0 - 5 RBC/hpf   WBC, UA 6-10 0 - 5 WBC/hpf   Bacteria, UA RARE (A) NONE SEEN   Squamous Epithelial / LPF 0-5 0 - 5   Mucus PRESENT    Amorphous Crystal PRESENT     Comment: Performed at [facility], [address], [place], [place]  Urine Drug Screen, Qualitative ([facility] only)     Status: Abnormal   Collection Time: 01/31/22  5:10 AM  Result Value Ref Range   Tricyclic, Ur Screen NONE DETECTED NONE DETECTED   Amphetamines, Ur Screen POSITIVE (A) NONE DETECTED   MDMA (Ecstasy)Ur Screen NONE DETECTED NONE DETECTED   Cocaine Metabolite,Ur [place] NONE DETECTED NONE DETECTED   Opiate, Ur Screen NONE DETECTED NONE DETECTED   Phencyclidine (PCP) Ur S NONE DETECTED NONE DETECTED   Cannabinoid 50 Ng, Ur [place] POSITIVE (A) NONE DETECTED   Barbiturates, Ur Screen NONE DETECTED NONE DETECTED   Benzodiazepine, Ur Scrn NONE DETECTED NONE DETECTED   Methadone Scn, Ur NONE DETECTED NONE DETECTED    Comment: (NOTE) Tricyclics + metabolites, urine    Cutoff 1000 ng/mL Amphetamines + metabolites, urine  Cutoff 1000 ng/mL MDMA (Ecstasy), urine              Cutoff 500 ng/mL Cocaine Metabolite, urine          Cutoff 300 ng/mL Opiate + metabolites, urine  Cutoff 300 ng/mL Phencyclidine (PCP), urine         Cutoff 25 ng/mL Cannabinoid, urine                 Cutoff 50 ng/mL Barbiturates + metabolites, urine  Cutoff 200 ng/mL Benzodiazepine, urine              Cutoff 200 ng/mL Methadone, urine                   Cutoff 300 ng/mL  The urine drug screen provides only a preliminary, unconfirmed analytical test result and should not be used for non-medical purposes. Clinical consideration and  professional judgment should be applied to any positive drug screen result due to possible interfering substances. A more specific alternate chemical method must be used in order to obtain a confirmed analytical result. Gas chromatography / mass spectrometry (GC/MS) is the preferred confirm atory method. Performed at [facility], [address] [place], [place] [address]   Protein / creatinine ratio, urine     Status: Abnormal   Collection Time: 01/31/22  5:10 AM  Result Value Ref Range   Creatinine, Urine 63 mg/dL   Total Protein, Urine 11 mg/dL    Comment: NO NORMAL RANGE ESTABLISHED FOR THIS TEST   Protein Creatinine Ratio 0.17 (H) 0.00 - 0.15 mg/mg[Cre]    Comment: Performed at [facility], [address] [place], [place] [address]  CBC     Status: Abnormal   Collection Time: 01/31/22  5:36 AM  Result Value Ref Range   WBC 17.1 (H) 4.0 - 10.5 K/uL   RBC 4.36 3.87 - 5.11 MIL/uL   Hemoglobin 13.1 12.0 - 15.0 g/dL   HCT 01.0 27.2 - 53.6 %   MCV 88.8 80.0 - 100.0 fL   MCH 30.0 26.0 - 34.0 pg   MCHC 33.9 30.0 - 36.0 g/dL   RDW 64.4 03.4 - 74.2 %   Platelets 176 150 - 400 K/uL   nRBC 0.0 0.0 - 0.2 %    Comment: Performed at [facility], [address] [place], [place] [address]  Comprehensive metabolic panel     Status: Abnormal   Collection Time: 01/31/22  5:36 AM  Result Value Ref Range   Sodium 138 135 - 145 mmol/L   Potassium 3.9 3.5 - 5.1 mmol/L   Chloride 107 98 - 111 mmol/L   CO2 25 22 - 32 mmol/L   Glucose, Bld 116 (H) 70 - 99 mg/dL    Comment: Glucose reference range applies only to samples taken after fasting for at least 8 hours.   BUN 11 6 - 20 mg/dL   Creatinine, Ser 8.75 0.44 - 1.00 mg/dL   Calcium 8.8 (L) 8.9 - 10.3 mg/dL   Total Protein 6.7 6.5 - 8.1 g/dL   Albumin 3.1 (L) 3.5 - 5.0 g/dL   AST 18 15 - 41 U/L   ALT 15 0 - 44 U/L   Alkaline Phosphatase 118 38 - 126 U/L   Total Bilirubin 0.5 0.3 - 1.2 mg/dL   GFR,  Estimated >64 >33 mL/min    Comment: (NOTE) Calculated using the CKD-EPI Creatinine Equation (2021)    Anion gap 6 5 - 15    Comment: Performed at [facility], [address] [place], [place] [address]  Type and screen [facility]     Status: None   Collection Time: 01/31/22  5:36 AM  Result Value Ref Range  ABO/RH(D) O POS    Antibody Screen NEG    Sample Expiration      02/03/2022,2359 Performed at [facility], [address] [place], [place] [address]     [place] Intraoperative  Result Date: 01/31/2022 CLINICAL DATA:  Ultrasound was provided for use by the ordering physician.  No provider Interpretation or professional fees incurred.    DG OR UROLOGY CYSTO IMAGE ([facility] ONLY)  Result Date: 01/31/2022 There is no interpretation for this exam.  This order is for images obtained during a surgical procedure.  Please See "Surgeries" Tab for more information regarding the procedure.   US OB Limited  Result Date: 01/31/2022 CLINICAL DATA:  Left flank pain.  No prenatal care. EXAM: LIMITED OBSTETRIC ULTRASOUND COMPARISON:  None similar FINDINGS: Number of Fetuses: 1 Heart Rate:  144 bpm Movement: Yes per sonographer exam Presentation: Cephalic Placental Location: Fundal and left lateral Previa: Not seen Amniotic Fluid (Subjective):  Within normal limits. AFI: 10 cm BPD: 7.7 cm 30 w  5 d MATERNAL FINDINGS: Cervix:  Appears closed with 4 cm length. Uterus/Adnexae: No abnormality visualized. IMPRESSION: 1. Single living intrauterine pregnancy measuring 30 weeks 5 days. 2. No unexpected finding. 3. This exam is performed on an emergent basis and does not comprehensively evaluate fetal size, dating, or anatomy. Electronically Signed   By: Jorje Guild M.D.   On: 01/31/2022 07:28   US RENAL  Result Date: 01/31/2022 CLINICAL DATA:  Left flank pain.  Third trimester pregnancy EXAM: RENAL / URINARY TRACT ULTRASOUND COMPLETE COMPARISON:  Renal ultrasound  09/08/2014 FINDINGS: Right Kidney: Renal measurements: 11 x 5 x 6 cm = volume: 170 mL. Mild hydronephrosis. Left Kidney: Renal measurements: 13 x 7 x 6 cm = volume: 260 mL. Moderate hydronephrosis with perinephric fluid. Bidirectional color Doppler flow seen at the hilum. Bladder: Appears normal for degree of bladder distention. Ureteral jets were not seen on either side during 3 minutes of visualization. IMPRESSION: 1. Moderate left hydronephrosis with perinephric stranding and relative nephromegaly implying urinary obstruction. Ureteral jets were not seen on either side during 3 minutes of surveillance. 2. Mild right hydronephrosis. Electronically Signed   By: Jorje Guild M.D.   On: 01/31/2022 07:18    Current scheduled medications  docusate sodium  100 mg Oral Daily   prenatal multivitamin  1 tablet Oral Q1200   [START ON 02/01/2022] prenatal multivitamin  1 tablet Oral Q1200    I have reviewed the patient's current medications.  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 Will transition to oral medications after urine culture results. - IV pain medication Morphine 2mg  q2hrs PRN pain - Prenatal labs ordered d/t no prenatal care - NST q shift - Continue routine antenatal care.   Gertie Fey, [facility]  01/31/2022 5:41 PM

## 2022-01-31 NOTE — H&P (Signed)
Kelsey Serrano is a 26 y.o. female. She is at 107w5d gestation. Patient's last menstrual period was 07/14/2021 (exact date). 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  Remote hx kidney stone around her first pregnancy 2015 Hx cigarette use Poor dentition  Chief complaint: Pt arrival via EMS, c/o severe 10/10 pain in her Left upper quadrant that starts in her upper/mid back and wraps around to LUQ. Pt screaming in pain upon CNM arrival to the room, sitting up on side of bed, has removed fetal monitors and BP cuff.  - pt reports 10/10 left flank pain radiating to LUQ; reports that pain started sometime overnight.  - reports active FM, denies VB, LOF or UCs.  - reports burning pain with urination, and "feels like a kidney stone". - reports ongoing nausea and vomiting with pain.  - denies recent drug use - once pt verbally calmed down, then she laid down on bed and was not responding to verbal questions, with moaning and shaking. Pt aroused and able to state clearly her name and DOB, and says the pain is so severe that she is shaking.  - denies URI sx, cough or chest pain.    S:  no CTX, no VB.no LOF,  Active fetal movement.  Denies: HA, visual changes, SOB, or RUQ/epigastric pain  Maternal Medical History:   Past Medical History:  Diagnosis Date   Abuse, drug or alcohol (HCC) 11/07/2010   Overview:  A.  Never IVDA.   prescription drugs, THC, methamphetamines.   B.   Denies any since March 2013.  No methamphetamines 04/24/2009 Addendum [name]- NCCSR check is negative, reviewed with PCP do not think current problem    Airway hyperreactivity 11/07/2010   Overview:  A.  No prior ED visits or hospitalization    Asthma    Calculus of kidney 10/25/2014   Chronic cough 01/01/2012   Overview:  A.  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  Depression, PTSA, mood disorder NOS B.  Psychiatrist:  Mathews Argyle C.  Therapist:  Elita Quick    Renal colic 10/25/2014   Renal disorder    Restless leg syndrome     Past Surgical History:  Procedure Laterality Date   COLONOSCOPY WITH PROPOFOL N/A 12/04/2014   Procedure: COLONOSCOPY WITH PROPOFOL;  Surgeon: Midge Minium, MD;  Location: [facility];  Service: Endoscopy;  Laterality: N/A;   DENTAL SURGERY     ESOPHAGOGASTRODUODENOSCOPY (EGD) WITH PROPOFOL N/A 12/04/2014   Procedure: ESOPHAGOGASTRODUODENOSCOPY (EGD) WITH PROPOFOL;  Surgeon: Midge Minium, MD;  Location: [facility];  Service: Endoscopy;  Laterality: N/A;   kidney stent     kidney stones Left    LITHOTRIPSY      Allergies  Allergen Reactions   Augmentin [Amoxicillin-Pot Clavulanate] Nausea And Vomiting   Estrogens Nausea And Vomiting    Pt states she is allergic to high doses of estrogens.   Lavender Oil Hives   Zyrtec [Cetirizine] Nausea And Vomiting    Prior to Admission medications   Medication Sig  Start Date End Date Taking? Authorizing Provider  acetaminophen (TYLENOL) 325 MG tablet Take 2 tablets (650 mg total) by mouth every 4 (four) hours as needed (for pain scale < 4). Patient not taking: Reported on 01/31/2022 12/01/20   Janyce Llanos, CNM  albuterol (VENTOLIN HFA) 108 (90 Base) MCG/ACT inhaler Inhale 2 puffs into the lungs every 6 (six) hours as needed for wheezing or shortness of breath. Patient not taking: Reported on 01/31/2022 10/05/19   Salena Saner, MD  benzocaine-Menthol (DERMOPLAST) 20-0.5 % AERO Apply 1 application topically as needed for irritation (perineal discomfort). 12/01/20   Janyce Llanos, CNM  dibucaine  (NUPERCAINAL) 1 % OINT Place 1 application rectally as needed for hemorrhoids (if tucks not working). 12/01/20   Janyce Llanos, CNM  fluticasone-salmeterol (ADVAIR HFA) [phone] MCG/ACT inhaler Inhale 2 puffs into the lungs 2 (two) times daily. 10/05/19   Salena Saner, MD  ibuprofen (ADVIL) 800 MG tablet Take 1 tablet (800 mg total) by mouth every 8 (eight) hours as needed for mild pain. Patient not taking: Reported on 11/29/2020 05/06/20   Ward, Layla Maw, DO  Prenatal Vit-Fe Fumarate-FA (MULTIVITAMIN-PRENATAL) 27-0.8 MG TABS tablet Take 1 tablet by mouth daily at 12 noon.    [provider]  senna-docusate (SENOKOT-S) 8.6-50 MG tablet Take 2 tablets by mouth daily. 12/02/20   Janyce Llanos, CNM  simethicone (MYLICON) 80 MG chewable tablet Chew 1 tablet (80 mg total) by mouth as needed for flatulence. 12/01/20   Janyce Llanos, CNM  witch hazel-glycerin (TUCKS) pad Apply 1 application topically as needed for hemorrhoids (for pain). 12/01/20   Janyce Llanos, CNM      Social History: She  reports that she has been smoking cigarettes. She has a 7.00 pack-year smoking history. She has never used smokeless tobacco. She reports current drug use. Drug: Marijuana. She reports that she does not drink alcohol.  Family History: family history includes Diabetes in her mother.   Review of Systems: A full review of systems was performed and negative except as noted in the HPI.     O:  BP 139/79   Pulse 95   LMP 07/14/2021 (Exact Date)   SpO2 100%   Vitals:   01/31/22 0506 01/31/22 0530 01/31/22 0545  BP: (!) 146/103 (!) 149/93 139/79    Results for orders placed or performed during the hospital encounter of 01/31/22 (from the past 48 hour(s))  Urinalysis, Complete w Microscopic Urine, Clean Catch   Collection Time: 01/31/22  5:10 AM  Result Value Ref Range   Color, Urine YELLOW (A) YELLOW   APPearance HAZY (A) CLEAR   Specific Gravity, Urine 1.015 1.005 -  1.030   pH 7.0 5.0 - 8.0   Glucose, UA NEGATIVE NEGATIVE mg/dL   Hgb urine dipstick SMALL (A) NEGATIVE   Bilirubin Urine NEGATIVE NEGATIVE   Ketones, ur NEGATIVE NEGATIVE mg/dL   Protein, ur NEGATIVE NEGATIVE mg/dL   Nitrite NEGATIVE NEGATIVE   Leukocytes,Ua NEGATIVE NEGATIVE   RBC / HPF 6-10 0 - 5 RBC/hpf   WBC, UA 6-10 0 - 5 WBC/hpf   Bacteria, UA RARE (A) NONE SEEN   Squamous Epithelial / LPF 0-5 0 - 5   Mucus PRESENT    Amorphous Crystal PRESENT   Urine Drug Screen, Qualitative ([facility] only)   Collection Time: 01/31/22  5:10 AM  Result Value Ref Range   Tricyclic, Ur Screen NONE DETECTED NONE DETECTED   Amphetamines, Ur Screen POSITIVE (A) NONE  DETECTED   MDMA (Ecstasy)Ur Screen NONE DETECTED NONE DETECTED   Cocaine Metabolite,Ur [place] NONE DETECTED NONE DETECTED   Opiate, Ur Screen NONE DETECTED NONE DETECTED   Phencyclidine (PCP) Ur S NONE DETECTED NONE DETECTED   Cannabinoid 50 Ng, Ur [place] POSITIVE (A) NONE DETECTED   Barbiturates, Ur Screen NONE DETECTED NONE DETECTED   Benzodiazepine, Ur Scrn NONE DETECTED NONE DETECTED   Methadone Scn, Ur NONE DETECTED NONE DETECTED  Protein / creatinine ratio, urine   Collection Time: 01/31/22  5:10 AM  Result Value Ref Range   Creatinine, Urine 63 mg/dL   Total Protein, Urine 11 mg/dL   Protein Creatinine Ratio 0.17 (H) 0.00 - 0.15 mg/mg[Cre]     Vitals:   01/31/22 0506 01/31/22 0530 01/31/22 0545  BP: (!) 146/103 (!) 149/93 139/79   Initial BP noted to be 146/103  Constitutional: NAD, AAOx3  HE/ENT: extraocular movements grossly intact, moist mucous membranes CV: RRR PULM: nl respiratory effort, CTABL     Abd: gravid, non-distended, soft; Left CVAT +, LUQ pain.       Ext: Non-tender, Nonedematous   Psych: mood appropriate, speech normal Pelvic: deferred  Fetal  monitoring: Cat I Appropriate for GA Baseline: 140bpm Variability: moderate Accelerations: 15*15bpm present Decelerations absent  TOCO: no UCs  Bedside US  performed: SIUP noted in cephalic presentation, posterior/fundal placenta noted. Subjectively normal amniotic fluid.   A/P: 26 y.o. [redacted]w[redacted]d here for antenatal surveillance for left flank and abdominal pain.   Principle Diagnosis:  Left flank and abdominal pain, No prenatal care  Pain mgmt: IV Morphine given, IV Zofran for nausea.  Fetal Wellbeing: Reassuring Cat 1 tracing, Reactive NST  Mild range BP on admit, pending P/C ratio, CMP and CBC. No sx Pre-E, no hx GHTN or PreE.  During last delivery and hospitalization (11/2020) , pt c/o left chest pain with productive cough, treated for LLL pneumonia; on chart review also has hx left lung nodules, neg for TB testing, no further workup.   UA completed, UDS pos for amphetamines and MJ.  OB US ordered for pregnancy dating Renal US due to Left flank pain.    Randa Ngo, CNM 01/31/2022  5:57 AM

## 2022-01-31 NOTE — Anesthesia Procedure Notes (Signed)
Procedure Name: Intubation Date/Time: 01/31/2022 2:56 PM  Performed by: Jaye Beagle, CRNAPre-anesthesia Checklist: Patient identified, Emergency Drugs available, Suction available and Patient being monitored Patient Re-evaluated:Patient Re-evaluated prior to induction Oxygen Delivery Method: Circle system utilized Preoxygenation: Pre-oxygenation with 100% oxygen Induction Type: IV induction Ventilation: Mask ventilation without difficulty Laryngoscope Size: McGraph and 3 Grade View: Grade I Tube type: Oral Tube size: 6.5 mm Number of attempts: 1 Airway Equipment and Method: Stylet and Oral airway Placement Confirmation: ETT inserted through vocal cords under direct vision, positive ETCO2 and breath sounds checked- equal and bilateral Secured at: 21 cm Tube secured with: Tape Dental Injury: Teeth and Oropharynx as per pre-operative assessment

## 2022-01-31 NOTE — H&P (View-Only) (Signed)
   Urology Consult  I have been asked to see the patient by Dr. Schermerhorn, for evaluation and management of left hydronephrosis.  Chief Complaint: Left flank pain  History of Present Illness: Kelsey Serrano is a 26 y.o. year old [redacted]w[redacted]d gravid female with no prenatal care who is admitted for intractable left-sided flank pain associated with hydronephrosis and no ureteral jets seen on renal ultrasound likely secondary to an obstructing ureteral stone.  She is a difficult historian as she does not answer questions readily as she is writhing in bed moaning and shaking secondary to the pain.  When questioned about her past medical history, she just screams look at my chart.    She was seen by Dr. Brandon in 2016 for spontaneous passage of a left UVJ stone and for right ureteroscopy for a 1 centimeter UPJ stone and a 1 cm proximal ureteral stone.  She did not follow-up in our office for renal ultrasound as scheduled but was seen in the ED for UTI renal ultrasound was performed and noted no hydronephrosis.    There is a note in the chart (09/15/2014) stating that she wanted her PCP to refer her to [facility], but I do not see where she has been seen by them.  VSS afebrile   This morning labs noted serum creatinine 0.59, WBC count of 17.1, urine drug screen was positive for amphetamines and cannabinoids, urinalysis with micro heme and urine cultures pending.   Past Medical History:  Diagnosis Date   Abuse, drug or alcohol (HCC) 11/07/2010   Overview:  A.  Never IVDA.   prescription drugs, THC, methamphetamines.   B.   Denies any since March 2013.  No methamphetamines 04/24/2009 Addendum Wolf- NCCSR check is negative, reviewed with PCP do not think current problem    Airway hyperreactivity 11/07/2010   Overview:  A.  No prior ED visits or hospitalization    Asthma    Calculus of kidney 10/25/2014   Chronic cough 01/01/2012   Overview:  A.  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  Depression, PTSA, mood disorder NOS B.  Psychiatrist:  Rachel Dew C.  Therapist:  Cheryl Lawson    Renal colic 10/25/2014   Renal disorder    Restless leg syndrome     Past Surgical History:  Procedure Laterality Date   COLONOSCOPY WITH PROPOFOL N/A 12/04/2014   Procedure: COLONOSCOPY WITH PROPOFOL;  Surgeon: Darren Wohl, MD;  Location: [facility];  Service: Endoscopy;  Laterality: N/A;   DENTAL SURGERY     ESOPHAGOGASTRODUODENOSCOPY (EGD) WITH PROPOFOL N/A 12/04/2014   Procedure: ESOPHAGOGASTRODUODENOSCOPY (EGD) WITH PROPOFOL;  Surgeon: Darren Wohl, MD;  Location: [facility];  Service: Endoscopy;  Laterality: N/A;   kidney stent     kidney stones Left    LITHOTRIPSY      Home Medications:    Allergies:  Allergies  Allergen Reactions   Augmentin [Amoxicillin-Pot Clavulanate] Nausea And Vomiting   Estrogens Nausea And Vomiting    Pt states she is allergic to high doses of estrogens.   Lavender Oil Hives   Zyrtec [Cetirizine] Nausea And Vomiting    Family History  Problem Relation   Age of Onset   Diabetes Mother     Social History:  reports that she has been smoking cigarettes. She has a 7.00 pack-year smoking history. She has never used smokeless tobacco. She reports current drug use. Drugs: Marijuana and Amphetamines. She reports that she does not drink alcohol.  ROS: A complete review of systems was performed.  All systems are negative except for pertinent findings as noted.  Physical Exam:  Vital signs in last 24 hours: Temp:  [98.2 F (36.8 C)] 98.2 F (36.8 C) (12/01 0706) Pulse Rate:  [95-122] 122 (12/01 0706) Resp:  [16] 16 (12/01 0706) BP: (139-149)/(73-103) 139/73 (12/01 0706) SpO2:  [99 %-100  %] 99 % (12/01 0800) Weight:  [69.4 kg] 69.4 kg (12/01 0800) Constitutional: Writhing in bed with pain  HEENT: Poor dentition.  Trachea midline Cardiovascular: No clubbing, cyanosis, or edema. Respiratory: Normal respiratory effort GI: Gravid GU: Left CVA tenderness Skin: No rashes, bruises or suspicious lesions Neurologic: Grossly intact, no focal deficits, moving all 4 extremities Psychiatric: Not cooperative and belligerent   Laboratory Data:  Recent Labs    01/31/22 0536  WBC 17.1*  HGB 13.1  HCT 38.7   Recent Labs    01/31/22 0536  NA 138  K 3.9  CL 107  CO2 25  GLUCOSE 116*  BUN 11  CREATININE 0.59  CALCIUM 8.8*   No results for input(s): "LABPT", "INR" in the last 72 hours. No results for input(s): "LABURIN" in the last 72 hours. Results for orders placed or performed during the hospital encounter of 11/29/20  Resp Panel by RT-PCR (Flu A&B, Covid) Nasopharyngeal Swab     Status: None   Collection Time: 11/29/20 10:16 AM   Specimen: Nasopharyngeal Swab; Nasopharyngeal(NP) swabs in vial transport medium  Result Value Ref Range Status   SARS Coronavirus 2 by RT PCR NEGATIVE NEGATIVE Final    Comment: (NOTE) SARS-CoV-2 target nucleic acids are NOT DETECTED.  The SARS-CoV-2 RNA is generally detectable in upper respiratory specimens during the acute phase of infection. The lowest concentration of SARS-CoV-2 viral copies this assay can detect is 138 copies/mL. A negative result does not preclude SARS-Cov-2 infection and should not be used as the sole basis for treatment or other patient management decisions. A negative result may occur with  improper specimen collection/handling, submission of specimen other than nasopharyngeal swab, presence of viral mutation(s) within the areas targeted by this assay, and inadequate number of viral copies(<138 copies/mL). A negative result must be combined with clinical observations, patient history, and  epidemiological information. The expected result is Negative.  Fact Sheet for Patients:  BloggerCourse.com  Fact Sheet for Healthcare Providers:  SeriousBroker.it  This test is no t yet approved or cleared by the [place] FDA and  has been authorized for detection and/or diagnosis of SARS-CoV-2 by FDA under an Emergency Use Authorization (EUA). This EUA will remain  in effect (meaning this test can be used) for the duration of the COVID-19 declaration under Section 564(b)(1) of the Act, 21 U.S.C.section 360bbb-3(b)(1), unless the authorization is terminated  or revoked sooner.       Influenza A by PCR NEGATIVE NEGATIVE Final   Influenza B by PCR NEGATIVE NEGATIVE Final    Comment: (NOTE) The Xpert Xpress SARS-CoV-2/FLU/RSV plus assay is intended as an aid in the diagnosis of influenza from Nasopharyngeal swab specimens and should not be used as a sole basis for treatment. Nasal washings and aspirates are unacceptable for Xpert Xpress SARS-CoV-2/FLU/RSV testing.  Fact  Sheet for Patients: https://www.fda.gov/media/152166/download  Fact Sheet for Healthcare Providers: https://www.fda.gov/media/152162/download  This test is not yet approved or cleared by the [place] FDA and has been authorized for detection and/or diagnosis of SARS-CoV-2 by FDA under an Emergency Use Authorization (EUA). This EUA will remain in effect (meaning this test can be used) for the duration of the COVID-19 declaration under Section 564(b)(1) of the Act, 21 U.S.C. section 360bbb-3(b)(1), unless the authorization is terminated or revoked.  Performed at [facility], [address] [place], [place] [address]      Radiologic Imaging: US OB Limited  Result Date: 01/31/2022 CLINICAL DATA:  Left flank pain.  No prenatal care. EXAM: LIMITED OBSTETRIC ULTRASOUND COMPARISON:  None similar FINDINGS: Number of Fetuses: 1 Heart Rate:   144 bpm Movement: Yes per sonographer exam Presentation: Cephalic Placental Location: Fundal and left lateral Previa: Not seen Amniotic Fluid (Subjective):  Within normal limits. AFI: 10 cm BPD: 7.7 cm 30 w  5 d MATERNAL FINDINGS: Cervix:  Appears closed with 4 cm length. Uterus/Adnexae: No abnormality visualized. IMPRESSION: 1. Single living intrauterine pregnancy measuring 30 weeks 5 days. 2. No unexpected finding. 3. This exam is performed on an emergent basis and does not comprehensively evaluate fetal size, dating, or anatomy. Electronically Signed   By: Jonathan  Watts M.D.   On: 01/31/2022 07:28   US RENAL  Result Date: 01/31/2022 CLINICAL DATA:  Left flank pain.  Third trimester pregnancy EXAM: RENAL / URINARY TRACT ULTRASOUND COMPLETE COMPARISON:  Renal ultrasound 09/08/2014 FINDINGS: Right Kidney: Renal measurements: 11 x 5 x 6 cm = volume: 170 mL. Mild hydronephrosis. Left Kidney: Renal measurements: 13 x 7 x 6 cm = volume: 260 mL. Moderate hydronephrosis with perinephric fluid. Bidirectional color Doppler flow seen at the hilum. Bladder: Appears normal for degree of bladder distention. Ureteral jets were not seen on either side during 3 minutes of visualization. IMPRESSION: 1. Moderate left hydronephrosis with perinephric stranding and relative nephromegaly implying urinary obstruction. Ureteral jets were not seen on either side during 3 minutes of surveillance. 2. Mild right hydronephrosis. Electronically Signed   By: Jonathan  Watts M.D.   On: 01/31/2022 07:18    Impression/Assessment:  26-year-old 28-week 5-day gravid female with a history of nephrolithiasis and polysubstance abuse who presented with 10 out of 10 left flank pain.  Renal ultrasound demonstrated moderate left hydronephrosis with perinephric stranding and relative nephromegaly and mild right hydronephrosis with no ureteral jets bilaterally.  Plan:  -Recommendation for the placement of left-sided PERC tube versus ureteral stent  as there is a high concern for the patient being lost to follow-up -Explained the procedure and the risk to the patient and she is wanting to proceed ASAP -She will need follow-up after she delivers for definitive treatment for the hydronephrosis as it is likely due to nephrolithiasis -Please make patient n.p.o.  01/31/2022, 9:32 AM  Jashira Cotugno, PA-C   

## 2022-01-31 NOTE — Progress Notes (Signed)
IR brief note   I saw Kelsey Serrano today around 1100 at the request of Dr. [name] with the Urology service.   She is approximately [redacted] weeks pregnant with clinical and imaging presentation most suggestive of left obstructive nephrolithiasis. Her pregnancy course has been complicated by concurrent drug use with positive UDS. I went to discuss nephrostomy placement with the patient, who expressed concern about a PCN, and that she preferred an internal stent. She seemed mostly concerned about having an external drainage catheter while having to care for 2 little children at home. I spoke with Urology, and returned to see the patient with Michiel Cowboy. After a lengthy interdisciplinary discussion, including the potential risks of continued pain and infection with an internal stent, the patient was not agreeable to PCN placement.   I agree with and share Dr. Delana Meyer concerns about internal stent placement, however, it is the patient's desire to proceed with one after a discussion of risks, benefits, and alternative treatment options including PCN. Should she not tolerate or change her mind, we can certainly reconsider left PCN placement.    Olive Bass, MD  Vascular and Interventional Radiology 01/31/2022 12:58 PM

## 2022-01-31 NOTE — Progress Notes (Signed)
Oriented patient to unit and introduced self. Bed in low, locked position, call bell in reach. Fluids running in PIV. Pt voided on bedside commode. Water, popsicle and crackers at bedside. Warm blanket given for comfort. Will continue to monitor.

## 2022-02-01 ENCOUNTER — Encounter: Payer: Self-pay | Admitting: Urology

## 2022-02-01 LAB — CBC
HCT: 30.6 % — ABNORMAL LOW (ref 36.0–46.0)
Hemoglobin: 10.5 g/dL — ABNORMAL LOW (ref 12.0–15.0)
MCH: 30.3 pg (ref 26.0–34.0)
MCHC: 34.3 g/dL (ref 30.0–36.0)
MCV: 88.2 fL (ref 80.0–100.0)
Platelets: 150 10*3/uL (ref 150–400)
RBC: 3.47 MIL/uL — ABNORMAL LOW (ref 3.87–5.11)
RDW: 13.2 % (ref 11.5–15.5)
WBC: 13.6 10*3/uL — ABNORMAL HIGH (ref 4.0–10.5)
nRBC: 0 % (ref 0.0–0.2)

## 2022-02-01 LAB — URINALYSIS, ROUTINE W REFLEX MICROSCOPIC
Bilirubin Urine: NEGATIVE
Glucose, UA: NEGATIVE mg/dL
Ketones, ur: NEGATIVE mg/dL
Nitrite: NEGATIVE
Protein, ur: NEGATIVE mg/dL
Specific Gravity, Urine: 1.005 (ref 1.005–1.030)
pH: 7 (ref 5.0–8.0)

## 2022-02-01 LAB — URINE CULTURE

## 2022-02-01 LAB — RPR: RPR Ser Ql: NONREACTIVE

## 2022-02-01 MED ORDER — NITROFURANTOIN MONOHYD MACRO 100 MG PO CAPS: 100.0000 mg | ORAL_CAPSULE | Freq: Two times a day (BID) | ORAL | 0 refills | Status: AC

## 2022-02-01 MED ORDER — OXYCODONE HCL 5 MG PO TABS: 5.0000 mg | ORAL_TABLET | Freq: Three times a day (TID) | ORAL | 0 refills | Status: DC | PRN

## 2022-02-01 MED ORDER — OXYCODONE HCL 5 MG PO TABS
5.0000 mg | ORAL_TABLET | Freq: Three times a day (TID) | ORAL | Status: DC | PRN
  Administered 2022-02-01: 5 mg via ORAL
  Filled 2022-02-01: qty 1

## 2022-02-01 NOTE — TOC Initial Note (Addendum)
Transition of Care [facility]) - Initial/Assessment Note    Patient Details  Name: Kelsey Serrano MRN: [id number] Date of Birth: August 09, 1995  [facility] CM/SW Contact:    Carmina Miller, LCSWA Phone Number: 02/01/2022, 2:59 PM  Clinical Narrative:                 Update: 4:48 PM-CSW attempted to speak with pt again, pt not available, going over dc instructions with CNM.  CSW received consult for drug use during pregnancy and late prenatal care, attempted to do an assessment of pt via phone, pt didn't answer. CSW will attempt to contact pt at another time, requested RN to notify this CSW when pt can talk on the phone.         Patient Goals and CMS Choice        Expected Discharge Plan and Services                                                Prior Living Arrangements/Services                       Activities of Daily Living Home Assistive Devices/Equipment: None ADL Screening (condition at time of admission) Patient's cognitive ability adequate to safely complete daily activities?: Yes Is the patient deaf or have difficulty hearing?: No Does the patient have difficulty seeing, even when wearing glasses/contacts?: No Does the patient have difficulty concentrating, remembering, or making decisions?: No Patient able to express need for assistance with ADLs?: Yes Does the patient have difficulty dressing or bathing?: No Independently performs ADLs?: Yes (appropriate for developmental age) Does the patient have difficulty walking or climbing stairs?: No Weakness of Legs: None Weakness of Arms/Hands: None  Permission Sought/Granted                  Emotional Assessment              Admission diagnosis:  Left flank pain [R10.9] Urinary obstruction [N13.9] Patient Active Problem List   Diagnosis Date Noted   Left flank pain 01/31/2022   Urinary obstruction 01/31/2022   Encounter for induction of labor 11/29/2020   Normal  labor 05/23/2019   Medication side effect 06/30/2015   Chronic diarrhea of unknown origin    Nausea with vomiting    Gastritis    D (diarrhea) 10/25/2014   Abdominal pain, epigastric 10/25/2014   Calculus of kidney 10/25/2014   Renal colic 10/25/2014   Current smoker 04/12/2013   Chronic cough 01/01/2012   Neurosis, 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 - [place], [place] - [address] [place] [address] Phone: [phone] Fax: [phone]  [id number] [place], [place] - [address] AT [facility] OF [address] [facility] [place] [address] Phone: [phone] Fax: [phone]  [id number] [place], [place] - [address] [place] [address] Phone: [phone] Fax: [phone]     Social Determinants of Health (SDOH) Interventions    Readmission Risk Interventions     No data to display

## 2022-02-01 NOTE — Progress Notes (Signed)
ANTEPARTUM PROGRESS NOTE  Kelsey Serrano is a 26 y.o. [id number] at [redacted]w[redacted]d who is admitted for left flank pain.  She is s/p cystoscopy and ultrasound guided placement of indwelling double -J urethral stent on the left Estimated Date of Delivery: 04/20/22  Length of Stay:  1 Days. Admitted 01/31/2022  Subjective: Patient was asleep, when I spoke to her she was able to answer questions appropriately but she did not open her eyes. She reported fatigue,      Vitals:  BP 119/66 (BP Location: Left Arm)   Pulse 88   Temp 98.8 F (37.1 C) (Oral)   Resp 18   Ht 5\' 2"  (1.575 m)   Wt 69.4 kg   LMP 07/14/2021 (Exact Date)   SpO2 97%   BMI 27.98 kg/m  Physical Examination: General:   no distress and sleepy  Skin:  normal  Neurologic:    Alert & oriented x 3  Lungs:    Nl effort  Heart:   regular rate and rhythm        Results for orders placed or performed during the hospital encounter of 01/31/22 (from the past 48 hour(s))  Urinalysis, Complete w Microscopic Urine, Clean Catch     Status: Abnormal   Collection Time: 01/31/22  5:10 AM  Result Value Ref Range   Color, Urine YELLOW (A) YELLOW   APPearance HAZY (A) CLEAR   Specific Gravity, Urine 1.015 1.005 - 1.030   pH 7.0 5.0 - 8.0   Glucose, UA NEGATIVE NEGATIVE mg/dL   Hgb urine dipstick SMALL (A) NEGATIVE   Bilirubin Urine NEGATIVE NEGATIVE   Ketones, ur NEGATIVE NEGATIVE mg/dL   Protein, ur NEGATIVE NEGATIVE mg/dL   Nitrite NEGATIVE NEGATIVE   Leukocytes,Ua NEGATIVE NEGATIVE   RBC / HPF 6-10 0 - 5 RBC/hpf   WBC, UA 6-10 0 - 5 WBC/hpf   Bacteria, UA RARE (A) NONE SEEN   Squamous Epithelial / LPF 0-5 0 - 5   Mucus PRESENT    Amorphous Crystal PRESENT     Comment: Performed at [facility], [address] [place], [place]  Urine Drug Screen, Qualitative ([facility] only)     Status: Abnormal   Collection Time: 01/31/22  5:10 AM  Result Value Ref Range   Tricyclic, Ur Screen NONE DETECTED NONE DETECTED    Amphetamines, Ur Screen POSITIVE (A) NONE DETECTED   MDMA (Ecstasy)Ur Screen NONE DETECTED NONE DETECTED   Cocaine Metabolite,Ur [place] NONE DETECTED NONE DETECTED   Opiate, Ur Screen NONE DETECTED NONE DETECTED   Phencyclidine (PCP) Ur S NONE DETECTED NONE DETECTED   Cannabinoid 50 Ng, Ur  POSITIVE (A) NONE DETECTED   Barbiturates, Ur Screen NONE DETECTED NONE DETECTED   Benzodiazepine, Ur Scrn NONE DETECTED NONE DETECTED   Methadone Scn, Ur NONE DETECTED NONE DETECTED    Comment: (NOTE) Tricyclics + metabolites, urine    Cutoff 1000 ng/mL Amphetamines + metabolites, urine  Cutoff 1000 ng/mL MDMA (Ecstasy), urine              Cutoff 500 ng/mL Cocaine Metabolite, urine          Cutoff 300 ng/mL Opiate + metabolites, urine        Cutoff 300 ng/mL Phencyclidine (PCP), urine         Cutoff 25 ng/mL Cannabinoid, urine                 Cutoff 50 ng/mL Barbiturates + metabolites, urine  Cutoff 200 ng/mL Benzodiazepine, urine  Cutoff 200 ng/mL Methadone, urine                   Cutoff 300 ng/mL  The urine drug screen provides only a preliminary, unconfirmed analytical test result and should not be used for non-medical purposes. Clinical consideration and professional judgment should be applied to any positive drug screen result due to possible interfering substances. A more specific alternate chemical method must be used in order to obtain a confirmed analytical result. Gas chromatography / mass spectrometry (GC/MS) is the preferred confirm atory method. Performed at [facility], [address] [place], [place] [address]   Protein / creatinine ratio, urine     Status: Abnormal   Collection Time: 01/31/22  5:10 AM  Result Value Ref Range   Creatinine, Urine 63 mg/dL   Total Protein, Urine 11 mg/dL    Comment: NO NORMAL RANGE ESTABLISHED FOR THIS TEST   Protein Creatinine Ratio 0.17 (H) 0.00 - 0.15 mg/mg[Cre]    Comment: Performed at [facility], [address] [place], [place] [address]  CBC     Status: Abnormal   Collection Time: 01/31/22  5:36 AM  Result Value Ref Range   WBC 17.1 (H) 4.0 - 10.5 K/uL   RBC 4.36 3.87 - 5.11 MIL/uL   Hemoglobin 13.1 12.0 - 15.0 g/dL   HCT 09.8 11.9 - 14.7 %   MCV 88.8 80.0 - 100.0 fL   MCH 30.0 26.0 - 34.0 pg   MCHC 33.9 30.0 - 36.0 g/dL   RDW 82.9 56.2 - 13.0 %   Platelets 176 150 - 400 K/uL   nRBC 0.0 0.0 - 0.2 %    Comment: Performed at [facility], [address] [place], [place] [address]  Comprehensive metabolic panel     Status: Abnormal   Collection Time: 01/31/22  5:36 AM  Result Value Ref Range   Sodium 138 135 - 145 mmol/L   Potassium 3.9 3.5 - 5.1 mmol/L   Chloride 107 98 - 111 mmol/L   CO2 25 22 - 32 mmol/L   Glucose, Bld 116 (H) 70 - 99 mg/dL    Comment: Glucose reference range applies only to samples taken after fasting for at least 8 hours.   BUN 11 6 - 20 mg/dL   Creatinine, Ser 4.69 0.44 - 1.00 mg/dL   Calcium 8.8 (L) 8.9 - 10.3 mg/dL   Total Protein 6.7 6.5 - 8.1 g/dL   Albumin 3.1 (L) 3.5 - 5.0 g/dL   AST 18 15 - 41 U/L   ALT 15 0 - 44 U/L   Alkaline Phosphatase 118 38 - 126 U/L   Total Bilirubin 0.5 0.3 - 1.2 mg/dL   GFR, Estimated >62 >95 mL/min    Comment: (NOTE) Calculated using the CKD-EPI Creatinine Equation (2021)    Anion gap 6 5 - 15    Comment: Performed at [facility], [address] [place], [place] [address]  Type and screen [facility]     Status: None   Collection Time: 01/31/22  5:36 AM  Result Value Ref Range   ABO/RH(D) O POS    Antibody Screen NEG    Sample Expiration      02/03/2022,2359 Performed at [facility], [address] [place], [place] [address]   Hepatitis B surface antigen     Status: None   Collection Time: 01/31/22  5:40 PM  Result Value Ref Range   Hepatitis B Surface  Ag NON REACTIVE NON REACTIVE    Comment: Performed at [facility], [address]  [place], [place] [address]  RPR     Status: None   Collection Time: 01/31/22  5:40 PM  Result Value Ref Range   RPR Ser Ql NON REACTIVE NON REACTIVE    Comment: Performed at [facility], [address] [place], [place] [address]  Rapid HIV screen (HIV 1/2 Ab+Ag)     Status: None   Collection Time: 01/31/22  5:40 PM  Result Value Ref Range   HIV-1 P24 Antigen - HIV24 NON REACTIVE NON REACTIVE    Comment: (NOTE) Detection of p24 may be inhibited by biotin in the sample, causing false negative results in acute infection.    HIV 1/2 Antibodies NON REACTIVE NON REACTIVE   Interpretation (HIV Ag Ab)      A non reactive test result means that HIV 1 or HIV 2 antibodies and HIV 1 p24 antigen were not detected in the specimen.    Comment: Performed at [facility], [address] [place], [place] [address]  Urinalysis, Routine w reflex microscopic Urine, Clean Catch     Status: Abnormal   Collection Time: 02/01/22  4:10 AM  Result Value Ref Range   Color, Urine YELLOW (A) YELLOW   APPearance HAZY (A) CLEAR   Specific Gravity, Urine 1.005 1.005 - 1.030   pH 7.0 5.0 - 8.0   Glucose, UA NEGATIVE NEGATIVE mg/dL   Hgb urine dipstick LARGE (A) NEGATIVE   Bilirubin Urine NEGATIVE NEGATIVE   Ketones, ur NEGATIVE NEGATIVE mg/dL   Protein, ur NEGATIVE NEGATIVE mg/dL   Nitrite NEGATIVE NEGATIVE   Leukocytes,Ua LARGE (A) NEGATIVE   RBC / HPF 0-5 0 - 5 RBC/hpf   WBC, UA 11-20 0 - 5 WBC/hpf   Bacteria, UA RARE (A) NONE SEEN   Squamous Epithelial / LPF 6-10 0 - 5    Comment: Performed at [facility], [address] [place], [place] [address]  CBC     Status: Abnormal   Collection Time: 02/01/22  6:07 AM  Result Value Ref Range   WBC 13.6 (H) 4.0 - 10.5 K/uL   RBC 3.47 (L) 3.87 - 5.11 MIL/uL   Hemoglobin 10.5 (L) 12.0 - 15.0 g/dL   HCT 86.530.6 (L) 78.436.0 - 69.646.0 %   MCV 88.2 80.0 - 100.0 fL   MCH 30.3 26.0 - 34.0 pg   MCHC 34.3 30.0 - 36.0 g/dL   RDW 29.513.2 28.411.5 - 13.215.5 %    Platelets 150 150 - 400 K/uL   nRBC 0.0 0.0 - 0.2 %    Comment: Performed at [facility], [address] [place], [place] [address]    US Intraoperative  Result Date: 01/31/2022 CLINICAL DATA:  Ultrasound was provided for use by the ordering physician.  No provider Interpretation or professional fees incurred.    DG OR UROLOGY CYSTO IMAGE ([facility] ONLY)  Result Date: 01/31/2022 There is no interpretation for this exam.  This order is for images obtained during a surgical procedure.  Please See "Surgeries" Tab for more information regarding the procedure.   US OB Limited  Result Date: 01/31/2022 CLINICAL DATA:  Left flank pain.  No prenatal care. EXAM: LIMITED OBSTETRIC ULTRASOUND COMPARISON:  None similar FINDINGS: Number of Fetuses: 1 Heart Rate:  144 bpm Movement: Yes per sonographer exam Presentation: Cephalic Placental Location: Fundal and left lateral Previa: Not seen Amniotic Fluid (Subjective):  Within normal  limits. AFI: 10 cm BPD: 7.7 cm 30 w  5 d MATERNAL FINDINGS: Cervix:  Appears closed with 4 cm length. Uterus/Adnexae: No abnormality visualized. IMPRESSION: 1. Single living intrauterine pregnancy measuring 30 weeks 5 days. 2. No unexpected finding. 3. This exam is performed on an emergent basis and does not comprehensively evaluate fetal size, dating, or anatomy. Electronically Signed   By: Tiburcio Pea M.D.   On: 01/31/2022 07:28   US RENAL  Result Date: 01/31/2022 CLINICAL DATA:  Left flank pain.  Third trimester pregnancy EXAM: RENAL / URINARY TRACT ULTRASOUND COMPLETE COMPARISON:  Renal ultrasound 09/08/2014 FINDINGS: Right Kidney: Renal measurements: 11 x 5 x 6 cm = volume: 170 mL. Mild hydronephrosis. Left Kidney: Renal measurements: 13 x 7 x 6 cm = volume: 260 mL. Moderate hydronephrosis with perinephric fluid. Bidirectional color Doppler flow seen at the hilum. Bladder: Appears normal for degree of bladder distention. Ureteral jets were not seen on either side  during 3 minutes of visualization. IMPRESSION: 1. Moderate left hydronephrosis with perinephric stranding and relative nephromegaly implying urinary obstruction. Ureteral jets were not seen on either side during 3 minutes of surveillance. 2. Mild right hydronephrosis. Electronically Signed   By: Tiburcio Pea M.D.   On: 01/31/2022 07:18    Current scheduled medications  docusate sodium  100 mg Oral Daily   prenatal multivitamin  1 tablet Oral Q1200   prenatal multivitamin  1 tablet Oral Q1200    I have reviewed the patient's current medications.  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 Pain  Tylenol 650mg  - last given today at 0831   Was able to sleep through the night with Tylenol for pain 3. Output  1901 - 0700 -- 1,745mL at 143.58mL/hr 4. Fetal wellbeing  NST qshift  Continue routine antenatal care.  72m, CNM 02/01/2022 9:31 AM

## 2022-02-01 NOTE — Progress Notes (Signed)
Patient discharged. Discharge instructions given. Patient verbalizes understanding. Transported by staff. 

## 2022-02-03 LAB — RUBELLA SCREEN: Rubella: 4.05 index (ref >0.99)

## 2022-02-07 ENCOUNTER — Other Ambulatory Visit: Payer: Self-pay | Admitting: Physician Assistant

## 2022-02-07 ENCOUNTER — Telehealth: Payer: Self-pay

## 2022-02-07 DIAGNOSIS — R109 Unspecified abdominal pain: Secondary | ICD-10-CM

## 2022-02-07 MED ORDER — TAMSULOSIN HCL 0.4 MG PO CAPS: 0.4000 mg | ORAL_CAPSULE | Freq: Every day | ORAL | 0 refills | Status: DC

## 2022-02-07 MED ORDER — OXYBUTYNIN CHLORIDE 5 MG PO TABS: 5.0000 mg | ORAL_TABLET | Freq: Three times a day (TID) | ORAL | 2 refills | Status: DC | PRN

## 2022-02-07 NOTE — Telephone Encounter (Signed)
Patient states that she has had continuous spasms from the stent and is currently out of pain medication. She is due to see you on Tuesday 12/14. Is there anything we could send her in during the interim?

## 2022-02-07 NOTE — Telephone Encounter (Signed)
Spoke with pt. After speaking with Dr. Apolinar Junes and Hilton Sinclair PA. It was determined that they will send in Rx for Tamsulosin and Oxybutynin in to the pharmacy. Patient will follow up next week with Dr. Apolinar Junes for additional discussion.

## 2022-02-07 NOTE — Discharge Summary (Signed)
Patient ID: Kelsey Serrano MRN: [id number] DOB/AGE: [birth year] 26 y.o.  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  Remote hx kidney stone around her first pregnancy 2015 Hx cigarette use Poor dentition  Prenatal Procedures:  Pain management - IV Morphine Renal u/s Urology consult Cystoscopy Ultrasound guided placement of indwelling double-J ureteral stent NSTs  Consults: Urology  Significant Diagnostic Studies:  Results for orders placed or performed during the hospital encounter of 01/31/22 (from the past 168 hour(s))  Urinalysis, Routine w reflex microscopic Urine, Clean Catch   Collection Time: 02/01/22  4:10 AM  Result Value Ref Range   Color, Urine YELLOW (A) YELLOW   APPearance HAZY (A) CLEAR   Specific Gravity, Urine 1.005 1.005 - 1.030   pH 7.0 5.0 - 8.0   Glucose, UA NEGATIVE NEGATIVE mg/dL   Hgb urine dipstick LARGE (A) NEGATIVE   Bilirubin Urine NEGATIVE NEGATIVE   Ketones, ur NEGATIVE NEGATIVE mg/dL   Protein, ur NEGATIVE NEGATIVE mg/dL   Nitrite NEGATIVE NEGATIVE   Leukocytes,Ua LARGE (A) NEGATIVE   RBC / HPF 0-5 0 - 5 RBC/hpf   WBC, UA 11-20 0 - 5 WBC/hpf   Bacteria, UA RARE (A) NONE SEEN   Squamous Epithelial / LPF 6-10 0 - 5  CBC   Collection Time: 02/01/22  6:07 AM  Result Value Ref Range   WBC 13.6 (H) 4.0 - 10.5 K/uL   RBC 3.47 (L) 3.87 - 5.11 MIL/uL   Hemoglobin 10.5 (L) 12.0 - 15.0 g/dL   HCT 71.0 (L) 62.6 - 94.8 %   MCV 88.2 80.0 - 100.0 fL   MCH 30.3 26.0 - 34.0 pg   MCHC 34.3 30.0 - 36.0 g/dL   RDW 54.6 27.0 - 35.0 %   Platelets 150 150 - 400 K/uL   nRBC 0.0 0.0 - 0.2 %    Treatments: IV hydration, antibiotics: Ceftriaxone, and analgesia: acetaminophen w/ codeine and Morphine, Fentanyl,  Oxycodone  Hospital Course:  This is a 26 y.o. [id number] with IUP at [redacted]w[redacted]d admitted for flank pain.  She had a renal u/s, followed by a Urology consult who then preforms a Cystoscopy and Ultrasound guided placement of indwelling double-J ureteral stent. She was observed and pain was managed and urine culture came back. She was deemed stable for discharge to home with outpatient follow up.  Discharge Physical Exam:  BP 127/73 (BP Location: Left Arm)   Pulse 98   Temp 98.5 F (36.9 C) (Oral)   Resp 18   Ht 5\' 2"  (1.575 m)   Wt 69.4 kg   LMP 07/14/2021 (Exact Date)   SpO2 97%   BMI 27.98 kg/m   General: NAD CV: RRR Pulm: nl effort ABD: s/nd/nt, gravid DVT Evaluation: LE non-ttp, no evidence of DVT on exam.  NST: FHR baseline: 125 bpm Variability: moderate Accelerations: yes Decelerations: none Category/reactivity: reactive  TOCO: quiet SVE: deferred      Discharge Condition: Stable  Disposition: Discharge disposition: 01-Home or Self Care        Allergies as of 02/01/2022       Reactions   Augmentin [amoxicillin-pot Clavulanate] Nausea And Vomiting   Estrogens Nausea And Vomiting   Pt states she is allergic to high doses of estrogens.   Lavender Oil Hives   Zyrtec [cetirizine] Nausea  And Vomiting        Medication List     STOP taking these medications    ibuprofen 800 MG tablet Commonly known as: ADVIL       TAKE these medications    acetaminophen 325 MG tablet Commonly known as: Tylenol Take 2 tablets (650 mg total) by mouth every 4 (four) hours as needed (for pain scale < 4).   Advair HFA 115-21 MCG/ACT inhaler Generic drug: fluticasone-salmeterol Inhale 2 puffs into the lungs 2 (two) times daily.   albuterol 108 (90 Base) MCG/ACT inhaler Commonly known as: VENTOLIN HFA Inhale 2 puffs into the lungs every 6 (six) hours as needed for wheezing or shortness of breath.   benzocaine-Menthol 20-0.5 % Aero Commonly known as: DERMOPLAST Apply  1 application topically as needed for irritation (perineal discomfort).   dibucaine 1 % Oint Commonly known as: NUPERCAINAL Place 1 application rectally as needed for hemorrhoids (if tucks not working).   multivitamin-prenatal 27-0.8 MG Tabs tablet Take 1 tablet by mouth daily at 12 noon.   nitrofurantoin (macrocrystal-monohydrate) 100 MG capsule Commonly known as: Macrobid Take 1 capsule (100 mg total) by mouth 2 (two) times daily for 10 days.   oxyCODONE 5 MG immediate release tablet Commonly known as: Oxy IR/ROXICODONE Take 1 tablet (5 mg total) by mouth every 8 (eight) hours as needed for severe pain or breakthrough pain.   senna-docusate 8.6-50 MG tablet Commonly known as: Senokot-S Take 2 tablets by mouth daily.   simethicone 80 MG chewable tablet Commonly known as: MYLICON Chew 1 tablet (80 mg total) by mouth as needed for flatulence.   witch hazel-glycerin pad Commonly known as: TUCKS Apply 1 application topically as needed for hemorrhoids (for pain).        Follow-up Information     Schermerhorn, Ihor Austin, MD. Schedule an appointment as soon as possible for a visit on 02/10/2022.   Specialty: Obstetrics and Gynecology Contact information: [address] [place] [address] [phone]         [facility]. Go on 02/07/2022.   Why: urine culture Contact information: [address] [place] [address] [phone]                Signed:  Quillian Quince 02/07/2022 5:56 PM

## 2022-02-12 MED ORDER — TRAMADOL HCL 50 MG PO TABS: 50.0000 mg | ORAL_TABLET | Freq: Four times a day (QID) | ORAL | 0 refills | Status: DC | PRN

## 2022-02-12 NOTE — Telephone Encounter (Signed)
Patient called back in today. States that she has been experiencing continued pain, States that she has pain even at rest. Would like to know if she could get something sent in for pain.

## 2022-02-12 NOTE — Telephone Encounter (Signed)
She has an appt tomorrow.  Will prescribed tramadol disp #4 until seen tomorrow.  [name], MD

## 2022-02-12 NOTE — Telephone Encounter (Signed)
Patient aware medication has been sent

## 2022-02-12 NOTE — Addendum Note (Signed)
Addended by: [name] on: 02/12/2022 03:01 PM   Modules accepted: Orders

## 2022-02-13 ENCOUNTER — Other Ambulatory Visit: Payer: Self-pay

## 2022-02-13 ENCOUNTER — Ambulatory Visit: Admitting: Urology

## 2022-02-13 DIAGNOSIS — R109 Unspecified abdominal pain: Secondary | ICD-10-CM

## 2022-02-14 ENCOUNTER — Ambulatory Visit: Admitting: Urology

## 2022-02-14 ENCOUNTER — Telehealth: Payer: Self-pay | Admitting: Urology

## 2022-02-14 NOTE — Telephone Encounter (Signed)
Unfortunately this patient know showed today after rescheduling yesterday's appointment.  She has a ureteral stent and I am concerned that she may be lost to follow-up.  Were not going to plan to do any surgery until after she delivers, her estimated due date is April 20, 2022.  Please schedule her for follow-up in January to time her post partum surgery.  [name], MD

## 2022-02-14 NOTE — Telephone Encounter (Signed)
Spoke with pt. Pt. Reported that she is sick along with all of her kids and "no one would've wanted to see me today". I informed her that she should've called our office to let us know. Scheduled follow up with Dr. Apolinar Junes on January 12th in [place]. We discussed that this was imperative to getting that stent out after delivery. Patient verbalized understanding.

## 2022-03-07 ENCOUNTER — Encounter: Payer: Self-pay | Admitting: Certified Nurse Midwife

## 2022-03-07 DIAGNOSIS — O0930 Supervision of pregnancy with insufficient antenatal care, unspecified trimester: Secondary | ICD-10-CM

## 2022-03-14 ENCOUNTER — Encounter: Payer: Self-pay | Admitting: Urology

## 2022-03-14 ENCOUNTER — Ambulatory Visit (INDEPENDENT_AMBULATORY_CARE_PROVIDER_SITE_OTHER): Payer: Medicaid Other | Admitting: Urology

## 2022-03-14 ENCOUNTER — Other Ambulatory Visit
Admission: RE | Admit: 2022-03-14 | Discharge: 2022-03-14 | Disposition: A | Discharge status: Home / Self Care | Payer: Medicaid Other | Source: Ambulatory Visit | Attending: Urology | Admitting: Urology

## 2022-03-14 ENCOUNTER — Other Ambulatory Visit: Payer: Self-pay

## 2022-03-14 VITALS — BP 130/83 | HR 103 | Ht 62.0 in | Wt 160.0 lb

## 2022-03-14 DIAGNOSIS — Z01818 Encounter for other preprocedural examination: Secondary | ICD-10-CM

## 2022-03-14 DIAGNOSIS — N1330 Unspecified hydronephrosis: Secondary | ICD-10-CM

## 2022-03-14 DIAGNOSIS — R109 Unspecified abdominal pain: Secondary | ICD-10-CM | POA: Diagnosis present

## 2022-03-14 LAB — URINALYSIS, COMPLETE (UACMP) WITH MICROSCOPIC
Bilirubin Urine: NEGATIVE
Glucose, UA: NEGATIVE mg/dL
Ketones, ur: NEGATIVE mg/dL
Nitrite: NEGATIVE
Protein, ur: 100 mg/dL — AB
RBC / HPF: >50 RBC/hpf (ref 0–5)
Specific Gravity, Urine: 1.025 (ref 1.005–1.030)
pH: 7 (ref 5.0–8.0)

## 2022-03-14 MED ORDER — CEPHALEXIN 500 MG PO CAPS: 500.0000 mg | ORAL_CAPSULE | Freq: Three times a day (TID) | ORAL | 0 refills | Status: DC

## 2022-03-14 NOTE — Patient Instructions (Signed)
                                                       Ureteroscopy  A Ureteroscopy is an examination of the upper urinary tract, performed with a ureteroscope that is passed through the urethra, the bladder, and then directly into the ureter. It is performed to find the cause of urine blockage in a ureter, perform a biopsy or identify and evaluate other abnormalities inside the ureters or kidneys. It is useful in the diagnosis and treatment of disorders such as kidney stones, ureteral strictures or other abnormalities of the ureter. Smaller stones in the bladder or lower ureter can be removed in one piece, while bigger ones are usually broken before removal during ureteroscopy.  *After your ureteroscopy, your doctor may need to place a stent in a ureter to drain urine from the kidney to the bladder while swelling in the ureter goes away. The stent may cause some discomfort to your kidney and ureter. The discomfort is generally mild. The stent may be left in for a week or more. You will be instructed to either remove the stent yourself at home by pulling a string protruding from your urethra or to return to our office for removal by your doctor.  After a ureteroscopy you may  have a mild burning feeling when urinating see small amounts of blood in the urine have mild discomfort in the bladder area or kidney area when urinating need to urinate more frequently or urgently *These problems should not last more than 24 hours unless a ureteral stent was placed.  If a stent was placed symptoms symptoms may continue until it is removed. You should notify your doctor right away if bleeding or pain is severe.  To prevent or relieve discomfort it can be helpful to drink 16 ounces of water each hour for 2 hours after the procedure take a warm bath to relieve the burning feeling hold a warm, damp washcloth over the urethral opening to relieve discomfort take an over-the-counter pain reliever  Please notify  our office if you experience any of the following symptoms burning with urination lasting more than 24hrs Fever greater than 100F or present directly to emergency department abdominal or flank pain very bloody, cloudy or foul smelling urine  *If you have any additional questions or concerns please call our office at [phone]  [place], [place] [address] [phone]

## 2022-03-14 NOTE — Progress Notes (Signed)
I, DeAsia L Maxie,acting as a scribe for Hollice Espy, MD.,have documented all relevant documentation on the behalf of Hollice Espy, MD,as directed by  Hollice Espy, MD while in the presence of Hollice Espy, MD.   03/14/22 1:14 PM   Kelsey Serrano, Kelsey Serrano [id number]  Chief Complaint  Patient presents with   Follow-up   Flank Pain    Discuss options    HPI: 27 year-old pregnant female who underwent emergent left uretral stent placement on 01/31/22 in the setting of left flank pain, left hydronephrosis and concern for infection with possible obstructing stone. She was considered a percutaneous nephrostomy tube at that time but she ultimately refused and elected to have a stent placed.   She has a personal history of kidney stones. She's now [redacted] weeks pregnant with an estimated due date in early February. 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  Urinalysis on 03/06/22 showed some white blood cells but no concern for infection.   PMH: Past Medical History:  Diagnosis Date   Abuse, drug or alcohol ([place]) 11/07/2010   Overview:  A.  Never IVDA.   prescription drugs, THC, methamphetamines.   B.   Denies any since March 2013.  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  No prior ED visits or hospitalization    Asthma    Calculus of kidney 10/25/2014   Chronic cough 01/01/2012   Overview:  A.  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  Depression, PTSA, mood disorder NOS B.  Psychiatrist:  Helene Kelp C.  Therapist:  Marjie Skiff    Renal colic 05/16/1759   Renal disorder    Restless leg syndrome     Surgical History: Past Surgical History:  Procedure Laterality Date   COLONOSCOPY WITH PROPOFOL N/A 12/04/2014   Procedure: COLONOSCOPY WITH PROPOFOL;  Surgeon: Lucilla Lame, MD;  Location: [place];  Service: Endoscopy;  Laterality: N/A;   CYSTOSCOPY W/ URETERAL STENT PLACEMENT Left 01/31/2022   Procedure: CYSTOSCOPY WITH RETROGRADE PYELOGRAM/URETERAL STENT PLACEMENT;  Surgeon: Hollice Espy, MD;  Location: [facility];  Service: Urology;  Laterality: Left;   DENTAL SURGERY     ESOPHAGOGASTRODUODENOSCOPY (EGD) WITH PROPOFOL N/A 12/04/2014   Procedure: ESOPHAGOGASTRODUODENOSCOPY (EGD) WITH PROPOFOL;  Surgeon: Lucilla Lame, MD;  Location: [place];  Service: Endoscopy;  Laterality: N/A;   kidney stent     kidney stones Left    LITHOTRIPSY      Home Medications:  Allergies as of 03/14/2022       Reactions   Augmentin [amoxicillin-pot Clavulanate] Nausea And Vomiting   Estrogens Nausea And Vomiting   Pt states she is allergic to high doses of estrogens.   Lavender Oil Hives   Zyrtec [cetirizine] Nausea And Vomiting        Medication List        Accurate as of March 14, 2022  1:14 PM. If you have any questions, ask your nurse or doctor.  STOP taking these medications    Advair HFA 115-21 MCG/ACT inhaler Generic drug: fluticasone-salmeterol Stopped by: Hollice Espy, MD   benzocaine-Menthol 20-0.5 % Aero Commonly known as: DERMOPLAST Stopped by: Hollice Espy, MD   dibucaine 1 % Oint Commonly known as: NUPERCAINAL Stopped by: Hollice Espy, MD   multivitamin-prenatal 27-0.8 MG Tabs tablet Stopped by: Hollice Espy, MD   oxybutynin 5 MG tablet Commonly known as: DITROPAN Stopped by: Hollice Espy, MD   oxyCODONE 5 MG immediate release tablet Commonly known as: Oxy IR/ROXICODONE Stopped by: Hollice Espy, MD    senna-docusate 8.6-50 MG tablet Commonly known as: Senokot-S Stopped by: Hollice Espy, MD   simethicone 80 MG chewable tablet Commonly known as: MYLICON Stopped by: Hollice Espy, MD   tamsulosin 0.4 MG Caps capsule Commonly known as: FLOMAX Stopped by: Hollice Espy, MD   traMADol 50 MG tablet Commonly known as: Ultram Stopped by: Hollice Espy, MD   witch hazel-glycerin pad Commonly known as: TUCKS Stopped by: Hollice Espy, MD       TAKE these medications    acetaminophen 325 MG tablet Commonly known as: Tylenol Take 2 tablets (650 mg total) by mouth every 4 (four) hours as needed (for pain scale < 4).   albuterol 108 (90 Base) MCG/ACT inhaler Commonly known as: VENTOLIN HFA Inhale into the lungs. What changed: Another medication with the same name was removed. Continue taking this medication, and follow the directions you see here. Changed by: Hollice Espy, MD   B-D 3CC LUER-LOK SYR 25GX5/8" 25G X 5/8" 3 ML Misc Generic drug: SYRINGE-NEEDLE (DISP) 3 ML USE AS DIRECTED FOR UP TO 30 DAYS   BD Syringe Slip Tip 26G X 5/8" 1 ML Misc Generic drug: SYRINGE/NEEDLE (DISP) 1 ML Use as directed for up to 30 days   heparin 10000 UNIT/ML injection SMARTSIG:1 Milliliter(s) SUB-Q Every 12 Hours   IRON PO Take 1 tablet by mouth daily.        Allergies:  Allergies  Allergen Reactions   Augmentin [Amoxicillin-Pot Clavulanate] Nausea And Vomiting   Estrogens Nausea And Vomiting    Pt states she is allergic to high doses of estrogens.   Lavender Oil Hives   Zyrtec [Cetirizine] Nausea And Vomiting    Family History: Family History  Problem Relation Age of Onset   Diabetes Mother     Social History:  reports that she has been smoking cigarettes. She has a 7.00 pack-year smoking history. She has been exposed to tobacco smoke. She has never used smokeless tobacco. She reports current drug use. Drugs: Marijuana and Amphetamines. She reports that she does not  drink alcohol.   Physical Exam: BP 130/83   Pulse (!) 103   Ht 5\' 2"  (1.575 m)   Wt 160 lb (72.6 kg)   LMP 07/14/2021 (Exact Date)   BMI 29.26 kg/m   Constitutional:  Alert and oriented, No acute distress. HEENT: [place] AT, moist mucus membranes.  Trachea midline, no masses. Neurologic: Grossly intact, no focal deficits, moving all 4 extremities. Psychiatric: Normal mood and affect.  Assessment & Plan:    1. Left hydronephrosis/presumed left uretral stone - We'll plan for ureteroscopy laser lithotripsy and a pre procedural CT non contrast shortly after delivery to assess her stone burden and surgical planning. We'll need a pre-op UA urine culture.  Risks and benefits of ureteroscopy were reviewed including but not limited to infection, bleeding, pain, ureteral injury which could require open surgery versus prolonged indwelling if ureteral perforation occurs, persistent stone disease,  requirement for staged procedure, possible stent, and global anesthesia risks. Patient expressed understanding and desires to proceed with ureteroscopy.   Return for after surgery.  I have reviewed the above documentation for accuracy and completeness, and I agree with the above.   [name], MD    [facility] [address] [place], [place] [address] [phone]

## 2022-03-15 LAB — URINE CULTURE: Culture: <10000 — AB

## 2022-03-17 ENCOUNTER — Other Ambulatory Visit: Payer: Self-pay | Admitting: Urology

## 2022-03-17 DIAGNOSIS — N201 Calculus of ureter: Secondary | ICD-10-CM

## 2022-03-17 NOTE — Progress Notes (Signed)
Surgical Physician Order Form [facility] [place]  * Scheduling expectation : Next Available  *Length of Case:   *Clearance needed: no  *Anticoagulation Instructions: Hold all anticoagulants  *Aspirin Instructions: Hold Aspirin  *Post-op visit Date/Instructions:   TBD  *Diagnosis: Left Ureteral Stone  *Procedure: left  Ureteroscopy w/laser lithotripsy & stent exchange [id number])   Additional orders: N/A  -Admit type: OUTpatient  -Anesthesia: General  -VTE Prophylaxis Standing Order SCD's       Other:   -Standing Lab Orders Per Anesthesia    Lab other:  May need UCx if not within 30 days for surgery  -Standing Test orders EKG/Chest x-ray per Anesthesia       Test other:   - Medications:  Ancef 2gm IV  ok with allergy  -Other orders: This patient needs a noncontrast CT scan prior to surgery which was ordered but postpartum.  Please ensure that this has been completed.  It is for surgical planning.

## 2022-03-26 ENCOUNTER — Ambulatory Visit: Payer: Medicaid Other

## 2022-03-26 ENCOUNTER — Other Ambulatory Visit: Payer: Medicaid Other

## 2022-03-26 NOTE — Progress Notes (Signed)
Patient will need to wait until after delivery to schedule surgery. Spoke with her today for an update. States that she is seeing her OB on 01/29 and will call to update me after. She states that they have pushed her due date back to 04/22/2022 but she possibly may be having a scheduled section.

## 2022-03-31 DIAGNOSIS — Z8759 Personal history of other complications of pregnancy, childbirth and the puerperium: Secondary | ICD-10-CM | POA: Insufficient documentation

## 2022-04-16 ENCOUNTER — Telehealth: Payer: Self-pay

## 2022-04-16 NOTE — Telephone Encounter (Signed)
I spoke with Kelsey Serrano. We have discussed possible surgery dates and Monday March 4th, 2024 was agreed upon by all parties. Patient given information about surgery date, what to expect pre-operatively and post operatively.  We discussed that a Pre-Admission Testing office will be calling to set up the pre-op visit that will take place prior to surgery, and that these appointments are typically done over the phone with a Pre-Admissions RN. Informed patient that our office will communicate any additional care to be provided after surgery. Patients questions or concerns were discussed during our call. Advised to call our office should there be any additional information, questions or concerns that arise. Patient verbalized understanding.

## 2022-04-16 NOTE — Progress Notes (Signed)
    Urology- Surgical Posting From  Surgery Date: Date: 05/05/2022  Surgeon: Dr. Hollice Espy, MD  Inpt ( No  )   Outpt (Yes)   Obs ( No  )   Diagnosis: N20.1 Left Ureteral Stone  -CPT: [phone]  Surgery: Left Ureteroscopy with Laser Lithotripsy and Stent Exchange  Stop Anticoagulations: Yes and also hold ASA  Cardiac/Medical/Pulmonary Clearance needed: no  *Orders entered into EPIC  Date: 04/16/22   *Case booked in [place]  Date: 04/15/2022  *Notified pt of Surgery: Date: 04/15/2022  PRE-OP UA & CX: Yes, will obtain in [place] on 04/22/2022  *Placed into Prior Authorization Work Fabio Bering Date: 04/16/22  Assistant/laser/rep:No

## 2022-04-22 ENCOUNTER — Encounter: Payer: Self-pay | Admitting: Radiology

## 2022-04-22 ENCOUNTER — Encounter: Payer: Self-pay | Admitting: Urology

## 2022-04-22 ENCOUNTER — Ambulatory Visit
Admission: RE | Admit: 2022-04-22 | Discharge: 2022-04-22 | Disposition: A | Discharge status: Home / Self Care | Payer: Medicaid Other | Source: Ambulatory Visit | Attending: Urology | Admitting: Urology

## 2022-04-22 ENCOUNTER — Other Ambulatory Visit
Admission: RE | Admit: 2022-04-22 | Discharge: 2022-04-22 | Disposition: A | Discharge status: Home / Self Care | Payer: Medicaid Other | Source: Ambulatory Visit | Attending: Urology | Admitting: Urology

## 2022-04-22 DIAGNOSIS — N201 Calculus of ureter: Secondary | ICD-10-CM

## 2022-04-22 DIAGNOSIS — Z01818 Encounter for other preprocedural examination: Secondary | ICD-10-CM

## 2022-04-22 DIAGNOSIS — R109 Unspecified abdominal pain: Secondary | ICD-10-CM | POA: Diagnosis not present

## 2022-04-22 LAB — URINALYSIS, COMPLETE (UACMP) WITH MICROSCOPIC
Glucose, UA: NEGATIVE mg/dL
Ketones, ur: NEGATIVE mg/dL
Nitrite: NEGATIVE
Protein, ur: 100 mg/dL — AB
Specific Gravity, Urine: 1.02 (ref 1.005–1.030)
pH: 8.5 — ABNORMAL HIGH (ref 5.0–8.0)

## 2022-04-23 ENCOUNTER — Telehealth: Payer: Self-pay

## 2022-04-23 ENCOUNTER — Ambulatory Visit (INDEPENDENT_AMBULATORY_CARE_PROVIDER_SITE_OTHER): Payer: Medicaid Other | Admitting: Urology

## 2022-04-23 ENCOUNTER — Encounter: Payer: Self-pay | Admitting: Urology

## 2022-04-23 DIAGNOSIS — Z466 Encounter for fitting and adjustment of urinary device: Secondary | ICD-10-CM

## 2022-04-23 DIAGNOSIS — N201 Calculus of ureter: Secondary | ICD-10-CM

## 2022-04-23 LAB — URINE CULTURE: Culture: NO GROWTH

## 2022-04-23 MED ORDER — CEPHALEXIN 250 MG PO CAPS: 500.0000 mg | ORAL_CAPSULE | Freq: Once | ORAL | Status: DC

## 2022-04-23 NOTE — Telephone Encounter (Signed)
-----   Message from Hollice Espy, MD sent at 04/22/2022  5:20 PM EST ----- CT scan shows NO stones.  Either it passed with stent or prior to stent placement or never there at all.  Recommend canceling surgery and just removing stent in the office.  Please schedule.    Hollice Espy, MD

## 2022-04-23 NOTE — Telephone Encounter (Signed)
See phone note. Patient spoke to [facility]

## 2022-04-23 NOTE — Telephone Encounter (Signed)
Spoke with pt. Pt. Scheduled for Stent Removal in [place] Friday March 8th, Pt. Aware. Awaiting culture results.

## 2022-04-24 NOTE — Progress Notes (Signed)
Patient no showed, arrived to wrong location and refused to travel to [place]. Will need to reschedule cysto/ stent removal.    Hollice Espy, MD

## 2022-04-25 NOTE — Telephone Encounter (Signed)
Spoke with Sam about patient's message. Per Sam bleeding most likely is coming from having the stent, this can come and go. Patient does not have pain or fever. Patient was advised to push fluids. Patient understood. Offered an appointment in [place] location if patient can go ahead and head this way. Patient asked if we can go ahead and take the stent out but I advised patient we will not be able to do that today. Patient stated she will just wait and if things get worse will go to ER.

## 2022-04-29 ENCOUNTER — Other Ambulatory Visit: Payer: Medicaid Other

## 2022-05-05 ENCOUNTER — Ambulatory Visit: Admit: 2022-05-05 | Payer: Medicaid Other | Admitting: Urology

## 2022-05-05 SURGERY — CYSTOSCOPY/URETEROSCOPY/HOLMIUM LASER/STENT PLACEMENT
Anesthesia: General | Laterality: Left

## 2022-05-07 NOTE — Telephone Encounter (Signed)
Kelsey Serrano spoke with Patient and scheduled her to come in tomorrow 05/08/22

## 2022-05-08 ENCOUNTER — Encounter: Payer: Self-pay | Admitting: Physician Assistant

## 2022-05-08 ENCOUNTER — Ambulatory Visit (INDEPENDENT_AMBULATORY_CARE_PROVIDER_SITE_OTHER): Payer: Medicaid Other | Admitting: Physician Assistant

## 2022-05-08 VITALS — BP 133/100 | HR 108 | Ht 62.0 in | Wt 160.0 lb

## 2022-05-08 DIAGNOSIS — R39198 Other difficulties with micturition: Secondary | ICD-10-CM | POA: Diagnosis not present

## 2022-05-08 LAB — MICROSCOPIC EXAMINATION
RBC, Urine: >30 /hpf — AB (ref 0–2)
WBC, UA: >30 /hpf — AB (ref 0–5)

## 2022-05-08 LAB — URINALYSIS, COMPLETE
Bilirubin, UA: NEGATIVE
Glucose, UA: NEGATIVE
Ketones, UA: NEGATIVE
Nitrite, UA: NEGATIVE
Specific Gravity, UA: 1.02 (ref 1.005–1.030)
Urobilinogen, Ur: 1 mg/dL (ref 0.2–1.0)
pH, UA: 7.5 (ref 5.0–7.5)

## 2022-05-08 LAB — BLADDER SCAN AMB NON-IMAGING: Scan Result: 14 mL

## 2022-05-08 MED ORDER — OXYBUTYNIN CHLORIDE ER 10 MG PO TB24: 10.0000 mg | ORAL_TABLET | Freq: Every day | ORAL | 0 refills | Status: DC

## 2022-05-08 MED ORDER — SULFAMETHOXAZOLE-TRIMETHOPRIM 800-160 MG PO TABS: 1.0000 | ORAL_TABLET | Freq: Two times a day (BID) | ORAL | 0 refills | Status: AC

## 2022-05-08 MED ORDER — TAMSULOSIN HCL 0.4 MG PO CAPS: 0.4000 mg | ORAL_CAPSULE | Freq: Every day | ORAL | 0 refills | Status: DC

## 2022-05-08 NOTE — Progress Notes (Signed)
05/08/2022 12:04 PM   Kelsey Serrano 06-09-1995 [id number]  CC: Chief Complaint  Patient presents with   Follow-up   HPI: Kelsey Serrano is a 27 y.o. female with PMH nephrolithiasis s/p left ureteral stent placement while pregnant for management of left renal colic awaiting stent removal with Dr. Erlene Quan later this month in the setting of no stone seen on postpartum CT imaging who presents today for evaluation of difficulty voiding.  Today she reports a 1 month history of dysuria, sensation of difficulty urinating, cloudy urine, and intermittent gross hematuria.  She reports new nausea and vomiting over the past 2 days but denies fever.  She is not breast-feeding.  She has not been taking Flomax or oxybutynin for management of her stent discomfort.  In-office UA today positive for 3+ blood, 2+ protein, and 3+ leukocytes; urine microscopy with >30 WBCs/HPF, >30 RBCs/HPF, triple phosphate crystals, and many bacteria.  PVR 29m.  PMH: Past Medical History:  Diagnosis Date   Abuse, drug or alcohol ([place] 11/07/2010   Overview:  A.  Never IVDA.   prescription drugs, THC, methamphetamines.   B.   Denies any since March 2013.  No methamphetamines 04/24/2009 Addendum WEliberto Ivory NCCSR check is negative, reviewed with PCP do not think current problem    Airway hyperreactivity 11/07/2010   Overview:  A.  No prior ED visits or hospitalization    Asthma    Calculus of kidney 10/25/2014   Chronic cough 01/01/2012   Overview:  A.  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  Depression, PTSA, mood disorder NOS B.  Psychiatrist:  RHelene KelpC.  Therapist:  CMarjie Skiff   Renal colic [id number]  Renal disorder    Restless leg syndrome      Surgical History: Past Surgical History:  Procedure Laterality Date   COLONOSCOPY WITH PROPOFOL N/A 12/04/2014   Procedure: COLONOSCOPY WITH PROPOFOL;  Surgeon: DLucilla Lame MD;  [place]  Service: Endoscopy;  Laterality: N/A;   CYSTOSCOPY W/ URETERAL STENT PLACEMENT Left 01/31/2022   Procedure: CYSTOSCOPY WITH RETROGRADE PYELOGRAM/URETERAL STENT PLACEMENT;  Surgeon: BHollice Espy MD;  Location: [facility];  Service: Urology;  Laterality: Left;   DENTAL SURGERY     ESOPHAGOGASTRODUODENOSCOPY (EGD) WITH PROPOFOL N/A 12/04/2014   Procedure: ESOPHAGOGASTRODUODENOSCOPY (EGD) WITH PROPOFOL;  Surgeon: DLucilla Lame MD;  [place]  Service: Endoscopy;  Laterality: N/A;   kidney stent     kidney stones Left    LITHOTRIPSY      Home Medications:  Allergies as of 05/08/2022       Reactions   Augmentin [amoxicillin-pot Clavulanate] Nausea And Vomiting   Estrogens Nausea And Vomiting   Pt states she is allergic to high doses of estrogens.   Lavender Oil Hives   Zyrtec [cetirizine] Nausea And Vomiting        Medication List        Accurate as of May 08, 2022 12:04 PM. If you have any questions, ask your nurse or doctor.          acetaminophen 325 MG tablet Commonly known as: Tylenol Take 2 tablets (650 mg total) by mouth every 4 (  four) hours as needed (for pain scale < 4).   acetaminophen 325 MG tablet Commonly known as: TYLENOL Take by mouth.   albuterol 108 (90 Base) MCG/ACT inhaler Commonly known as: VENTOLIN HFA Inhale into the lungs.   B-D 3CC LUER-LOK SYR 25GX5/8" 25G X 5/8" 3 ML Misc Generic drug: SYRINGE-NEEDLE (DISP) 3 ML USE AS DIRECTED FOR UP TO 30 DAYS   cephALEXin 500 MG capsule Commonly known as: Keflex Take 1 capsule (500 mg total) by mouth 3 (three) times daily.   heparin 10000 UNIT/ML injection SMARTSIG:1 Milliliter(s) SUB-Q Every 12 Hours   ibuprofen 200 MG tablet Commonly known as: ADVIL Take by mouth.    IRON PO Take 1 tablet by mouth daily.   oxybutynin 10 MG 24 hr tablet Commonly known as: DITROPAN-XL Take 1 tablet (10 mg total) by mouth daily. Started by: Debroah Loop, PA-C   sulfamethoxazole-trimethoprim 800-160 MG tablet Commonly known as: BACTRIM DS Take 1 tablet by mouth 2 (two) times daily for 7 days. Started by: Debroah Loop, PA-C   tamsulosin 0.4 MG Caps capsule Commonly known as: FLOMAX Take 1 capsule (0.4 mg total) by mouth daily. Started by: Debroah Loop, PA-C        Allergies:  Allergies  Allergen Reactions   Augmentin [Amoxicillin-Pot Clavulanate] Nausea And Vomiting   Estrogens Nausea And Vomiting    Pt states she is allergic to high doses of estrogens.   Lavender Oil Hives   Zyrtec [Cetirizine] Nausea And Vomiting    Family History: Family History  Problem Relation Age of Onset   Diabetes Mother     Social History:   reports that she has been smoking cigarettes. She has a 7.00 pack-year smoking history. She has been exposed to tobacco smoke. She has never used smokeless tobacco. She reports current drug use. Drugs: Marijuana and Amphetamines. She reports that she does not drink alcohol.  Physical Exam: BP (!) 133/100   Pulse (!) 108   Ht '5\' 2"'$  (1.575 m)   Wt 160 lb (72.6 kg)   LMP 07/14/2021 (Exact Date) Comment: delivered 04/07/22, no period. preg form signed  BMI 29.26 kg/m   Constitutional:  Alert and oriented, no acute distress, nontoxic appearing HEENT: [place], AT Cardiovascular: No clubbing, cyanosis, or edema Respiratory: Normal respiratory effort, no increased work of breathing Skin: No rashes, bruises or suspicious lesions Neurologic: Grossly intact, no focal deficits, moving all 4 extremities Psychiatric: Normal mood and affect  Laboratory Data: Results for orders placed or performed in visit on 05/08/22  Microscopic Examination   Urine  Result Value Ref Range   WBC, UA >30 (A) 0 - 5 /hpf   RBC, Urine >30  (A) 0 - 2 /hpf   Epithelial Cells (non renal) 0-10 0 - 10 /hpf   Casts Present (A) None seen /lpf   Cast Type Hyaline casts N/A   Crystals Present (A) N/A   Crystal Type Triple Phosphate N/A   Mucus, UA Present (A) Not Estab.   Bacteria, UA Many (A) None seen/Few  Urinalysis, Complete  Result Value Ref Range   Specific Gravity, UA 1.020 1.005 - 1.030   pH, UA 7.5 5.0 - 7.5   Color, UA Yellow Yellow   Appearance Ur Cloudy (A) Clear   Leukocytes,UA 3+ (A) Negative   Protein,UA 2+ (A) Negative/Trace   Glucose, UA Negative Negative   Ketones, UA Negative Negative   RBC, UA 3+ (A) Negative   Bilirubin, UA Negative Negative   Urobilinogen, Ur 1.0 0.2 -  1.0 mg/dL   Nitrite, UA Negative Negative   Microscopic Examination See below:   BLADDER SCAN AMB NON-IMAGING  Result Value Ref Range   Scan Result 14 mL   Assessment & Plan:   1. Difficulty urinating PVR WNL today.  UA is notable for pyuria, microscopic hematuria, and bacteriuria.  Will start empiric Bactrim and send for culture for further analysis for treatment of complicated UTI, though we discussed that if her culture comes back negative then her symptoms more likely represent anticipated stent discomfort and we can stop antibiotics early.  I feel Bactrim is appropriate given her reports that she is not breast-feeding.  I also refilled oxybutynin and Flomax and counseled her to take these to manage her stent discomfort, especially around the time of stent removal to ease any discomfort associated with that.  I also moved up her scheduled stent removal with Dr. Erlene Quan to next week in our [facility] location.  I provided her with a note for her employer verifying her office visit today. - Urinalysis, Complete - BLADDER SCAN AMB NON-IMAGING - CULTURE, URINE COMPREHENSIVE - oxybutynin (DITROPAN-XL) 10 MG 24 hr tablet; Take 1 tablet (10 mg total) by mouth daily.  Dispense: 30 tablet; Refill: 0 - tamsulosin (FLOMAX) 0.4 MG CAPS  capsule; Take 1 capsule (0.4 mg total) by mouth daily.  Dispense: 30 capsule; Refill: 0 - sulfamethoxazole-trimethoprim (BACTRIM DS) 800-160 MG tablet; Take 1 tablet by mouth 2 (two) times daily for 7 days.  Dispense: 14 tablet; Refill: 0  Return in 6 days (on 05/14/2022) for Stent removal.  Debroah Loop, [facility]  [facility] [address] [place], [place] [address] [phone]

## 2022-05-08 NOTE — Patient Instructions (Addendum)
Start the Bactrim (sulfamethoxazole-trimethoprim) antibiotic today. Start the Flomax and oxybutynin at least 3 days prior to your stent removal; you may continue them for 1-2 days after stent removal to ease any removal-related discomfort. If you spike any fevers, please let us know immediately or go to the Emergency Department.

## 2022-05-09 ENCOUNTER — Encounter: Payer: Medicaid Other | Admitting: Urology

## 2022-05-13 LAB — CULTURE, URINE COMPREHENSIVE

## 2022-05-14 ENCOUNTER — Encounter: Payer: Self-pay | Admitting: Urology

## 2022-05-14 ENCOUNTER — Ambulatory Visit
Admission: RE | Admit: 2022-05-14 | Discharge: 2022-05-14 | Disposition: A | Discharge status: Home / Self Care | Payer: Medicaid Other | Source: Ambulatory Visit | Attending: Urology | Admitting: Urology

## 2022-05-14 ENCOUNTER — Other Ambulatory Visit: Payer: Self-pay

## 2022-05-14 ENCOUNTER — Ambulatory Visit: Payer: Medicaid Other

## 2022-05-14 ENCOUNTER — Encounter
Admission: RE | Disposition: A | Discharge status: Home / Self Care | Payer: Self-pay | Source: Ambulatory Visit | Attending: Urology

## 2022-05-14 ENCOUNTER — Ambulatory Visit (INDEPENDENT_AMBULATORY_CARE_PROVIDER_SITE_OTHER): Payer: Medicaid Other | Admitting: Urology

## 2022-05-14 ENCOUNTER — Ambulatory Visit: Payer: Medicaid Other | Admitting: General Practice

## 2022-05-14 VITALS — BP 142/92 | HR 94 | Ht 62.0 in | Wt 160.0 lb

## 2022-05-14 DIAGNOSIS — Z960 Presence of urogenital implants: Secondary | ICD-10-CM

## 2022-05-14 DIAGNOSIS — T8389XA Other specified complication of genitourinary prosthetic devices, implants and grafts, initial encounter: Secondary | ICD-10-CM | POA: Diagnosis not present

## 2022-05-14 DIAGNOSIS — Z466 Encounter for fitting and adjustment of urinary device: Secondary | ICD-10-CM | POA: Insufficient documentation

## 2022-05-14 HISTORY — PX: CYSTOSCOPY/URETEROSCOPY/HOLMIUM LASER/STENT PLACEMENT: SHX6546

## 2022-05-14 SURGERY — CYSTOSCOPY/URETEROSCOPY/HOLMIUM LASER/STENT PLACEMENT
Anesthesia: General | Site: Ureter | Laterality: Left

## 2022-05-14 MED ORDER — GLYCOPYRROLATE 0.2 MG/ML IJ SOLN
INTRAMUSCULAR | Status: DC | PRN
  Administered 2022-05-14: .2 mg via INTRAVENOUS

## 2022-05-14 MED ORDER — DEXAMETHASONE SODIUM PHOSPHATE 10 MG/ML IJ SOLN
INTRAMUSCULAR | Status: DC | PRN
  Administered 2022-05-14: 10 mg via INTRAVENOUS

## 2022-05-14 MED ORDER — LIDOCAINE HCL URETHRAL/MUCOSAL 2 % EX GEL
CUTANEOUS | Status: DC | PRN
  Administered 2022-05-14: 1

## 2022-05-14 MED ORDER — MIDAZOLAM HCL 2 MG/2ML IJ SOLN
INTRAMUSCULAR | Status: AC
  Filled 2022-05-14: qty 2

## 2022-05-14 MED ORDER — OXYCODONE HCL 5 MG PO TABS
ORAL_TABLET | ORAL | Status: AC
  Filled 2022-05-14: qty 1

## 2022-05-14 MED ORDER — FENTANYL CITRATE (PF) 100 MCG/2ML IJ SOLN
INTRAMUSCULAR | Status: AC
  Filled 2022-05-14: qty 2

## 2022-05-14 MED ORDER — ONDANSETRON HCL 4 MG/2ML IJ SOLN
INTRAMUSCULAR | Status: AC
  Filled 2022-05-14: qty 2

## 2022-05-14 MED ORDER — DEXAMETHASONE SODIUM PHOSPHATE 10 MG/ML IJ SOLN
INTRAMUSCULAR | Status: AC
  Filled 2022-05-14: qty 1

## 2022-05-14 MED ORDER — CEFAZOLIN SODIUM-DEXTROSE 2-4 GM/100ML-% IV SOLN
2.0000 g | INTRAVENOUS | Status: AC
  Administered 2022-05-14: 2 g via INTRAVENOUS

## 2022-05-14 MED ORDER — OXYCODONE HCL 5 MG PO TABS
5.0000 mg | ORAL_TABLET | Freq: Once | ORAL | Status: AC | PRN
  Administered 2022-05-14: 5 mg via ORAL

## 2022-05-14 MED ORDER — HYDROCODONE-ACETAMINOPHEN 5-325 MG PO TABS: 1.0000 | ORAL_TABLET | Freq: Four times a day (QID) | ORAL | 0 refills | Status: DC | PRN

## 2022-05-14 MED ORDER — CHLORHEXIDINE GLUCONATE 0.12 % MT SOLN
OROMUCOSAL | Status: AC
  Administered 2022-05-14: 15 mL via OROMUCOSAL
  Filled 2022-05-14: qty 15

## 2022-05-14 MED ORDER — ONDANSETRON HCL 4 MG/2ML IJ SOLN
INTRAMUSCULAR | Status: DC | PRN
  Administered 2022-05-14: 4 mg via INTRAVENOUS

## 2022-05-14 MED ORDER — ORAL CARE MOUTH RINSE: 15.0000 mL | Freq: Once | OROMUCOSAL | Status: AC

## 2022-05-14 MED ORDER — SUCCINYLCHOLINE CHLORIDE 200 MG/10ML IV SOSY
PREFILLED_SYRINGE | INTRAVENOUS | Status: AC
  Filled 2022-05-14: qty 10

## 2022-05-14 MED ORDER — PROPOFOL 10 MG/ML IV BOLUS
INTRAVENOUS | Status: AC
  Filled 2022-05-14: qty 20

## 2022-05-14 MED ORDER — PROPOFOL 10 MG/ML IV BOLUS
INTRAVENOUS | Status: DC | PRN
  Administered 2022-05-14: 50 mg via INTRAVENOUS
  Administered 2022-05-14: 150 mg via INTRAVENOUS

## 2022-05-14 MED ORDER — IOHEXOL 180 MG/ML  SOLN
INTRAMUSCULAR | Status: DC | PRN
  Administered 2022-05-14 (×2): 10 mL

## 2022-05-14 MED ORDER — FENTANYL CITRATE (PF) 100 MCG/2ML IJ SOLN
INTRAMUSCULAR | Status: AC
  Administered 2022-05-14: 50 ug via INTRAVENOUS
  Filled 2022-05-14: qty 2

## 2022-05-14 MED ORDER — MIDAZOLAM HCL 2 MG/2ML IJ SOLN
INTRAMUSCULAR | Status: DC | PRN
  Administered 2022-05-14: 2 mg via INTRAVENOUS

## 2022-05-14 MED ORDER — FENTANYL CITRATE (PF) 100 MCG/2ML IJ SOLN
25.0000 ug | INTRAMUSCULAR | Status: DC | PRN
  Administered 2022-05-14: 25 ug via INTRAVENOUS

## 2022-05-14 MED ORDER — FENTANYL CITRATE (PF) 100 MCG/2ML IJ SOLN
INTRAMUSCULAR | Status: DC | PRN
  Administered 2022-05-14: 25 ug via INTRAVENOUS
  Administered 2022-05-14: 50 ug via INTRAVENOUS
  Administered 2022-05-14: 25 ug via INTRAVENOUS

## 2022-05-14 MED ORDER — GLYCOPYRROLATE 0.2 MG/ML IJ SOLN
INTRAMUSCULAR | Status: AC
  Filled 2022-05-14: qty 1

## 2022-05-14 MED ORDER — LACTATED RINGERS IV SOLN: INTRAVENOUS | Status: DC

## 2022-05-14 MED ORDER — OXYBUTYNIN CHLORIDE 5 MG PO TABS
ORAL_TABLET | ORAL | Status: AC
  Administered 2022-05-14: 5 mg via ORAL
  Filled 2022-05-14: qty 1

## 2022-05-14 MED ORDER — LIDOCAINE HCL (PF) 2 % IJ SOLN
INTRAMUSCULAR | Status: AC
  Filled 2022-05-14: qty 5

## 2022-05-14 MED ORDER — CHLORHEXIDINE GLUCONATE 0.12 % MT SOLN: 15.0000 mL | Freq: Once | OROMUCOSAL | Status: AC

## 2022-05-14 MED ORDER — DEXMEDETOMIDINE HCL IN NACL 200 MCG/50ML IV SOLN
INTRAVENOUS | Status: DC | PRN
  Administered 2022-05-14: 12 ug via INTRAVENOUS
  Administered 2022-05-14: 20 ug via INTRAVENOUS

## 2022-05-14 MED ORDER — LIDOCAINE HCL URETHRAL/MUCOSAL 2 % EX GEL
CUTANEOUS | Status: AC
  Filled 2022-05-14: qty 10

## 2022-05-14 MED ORDER — OXYCODONE HCL 5 MG/5ML PO SOLN: 5.0000 mg | Freq: Once | ORAL | Status: AC | PRN

## 2022-05-14 MED ORDER — CEFAZOLIN SODIUM-DEXTROSE 2-4 GM/100ML-% IV SOLN
INTRAVENOUS | Status: AC
  Filled 2022-05-14: qty 100

## 2022-05-14 MED ORDER — OXYBUTYNIN CHLORIDE 5 MG PO TABS: 5.0000 mg | ORAL_TABLET | Freq: Once | ORAL | Status: AC

## 2022-05-14 MED ORDER — SODIUM CHLORIDE 0.9 % IR SOLN
Status: DC | PRN
  Administered 2022-05-14: 3000 mL via INTRAVESICAL

## 2022-05-14 MED ORDER — SUCCINYLCHOLINE CHLORIDE 200 MG/10ML IV SOSY
PREFILLED_SYRINGE | INTRAVENOUS | Status: DC | PRN
  Administered 2022-05-14: 100 mg via INTRAVENOUS

## 2022-05-14 MED ORDER — LIDOCAINE HCL (CARDIAC) PF 100 MG/5ML IV SOSY
PREFILLED_SYRINGE | INTRAVENOUS | Status: DC | PRN
  Administered 2022-05-14: 80 mg via INTRAVENOUS

## 2022-05-14 SURGICAL SUPPLY — 25 items
ADHESIVE MASTISOL STRL (MISCELLANEOUS) IMPLANT
BAG DRAIN SIEMENS DORNER NS (MISCELLANEOUS) ×1 IMPLANT
BASKET ZERO TIP 1.9FR (BASKET) IMPLANT
BRUSH SCRUB EZ 1% IODOPHOR (MISCELLANEOUS) ×1 IMPLANT
CATH URET FLEX-TIP 2 LUMEN 10F (CATHETERS) IMPLANT
CATH URETL OPEN 5X70 (CATHETERS) ×1 IMPLANT
CNTNR URN SCR LID CUP LEK RST (MISCELLANEOUS) IMPLANT
CONT SPEC 4OZ STRL OR WHT (MISCELLANEOUS)
DRAPE UTILITY 15X26 TOWEL STRL (DRAPES) ×1 IMPLANT
DRSG TEGADERM 2-3/8X2-3/4 SM (GAUZE/BANDAGES/DRESSINGS) IMPLANT
FIBER LASER MOSES 200 DFL (Laser) IMPLANT
GLOVE BIO SURGEON STRL SZ 6.5 (GLOVE) ×1 IMPLANT
GOWN STRL REUS W/ TWL LRG LVL3 (GOWN DISPOSABLE) ×2 IMPLANT
GOWN STRL REUS W/TWL LRG LVL3 (GOWN DISPOSABLE) ×2
GUIDEWIRE GREEN .038 145CM (MISCELLANEOUS) IMPLANT
GUIDEWIRE STR DUAL SENSOR (WIRE) ×1 IMPLANT
IV NS IRRIG 3000ML ARTHROMATIC (IV SOLUTION) ×1 IMPLANT
KIT TURNOVER CYSTO (KITS) ×1 IMPLANT
PACK CYSTO AR (MISCELLANEOUS) ×1 IMPLANT
SET CYSTO W/LG BORE CLAMP LF (SET/KITS/TRAYS/PACK) ×1 IMPLANT
SHEATH NAVIGATOR HD 12/14X36 (SHEATH) IMPLANT
STENT URET 6FRX24 CONTOUR (STENTS) IMPLANT
STENT URET 6FRX26 CONTOUR (STENTS) IMPLANT
SURGILUBE 2OZ TUBE FLIPTOP (MISCELLANEOUS) ×1 IMPLANT
WATER STERILE IRR 500ML POUR (IV SOLUTION) ×1 IMPLANT

## 2022-05-14 NOTE — Telephone Encounter (Signed)
Spoke with patient see other mychart message

## 2022-05-14 NOTE — Op Note (Signed)
Date of procedure: 05/14/22  Preoperative diagnosis:  Retained left ureteral stent  Postoperative diagnosis:  Same as above  Procedure: Cystoscopy Left retrograde gram Removal of retained left ureteral stent  Surgeon: Hollice Espy, MD  Anesthesia: General  Complications: None  Intraoperative findings: Scout imaging shows proximal coil at the level of the proximal third of the ureter with a partial coil.  No significant encrustation appreciated.  Stent removed with minimal difficulty after injecting lidocaine jelly alongside of the stent.  Final retrograde showed no obvious obstructing debris and prompt excretion of contrast thus stent not replaced.  EBL: Minimal  Specimens: None  Drains: None  Indication: Kelsey Serrano is a 27 y.o. patient with female with partially removed retained stent.  After reviewing the management options for treatment, she elected to proceed with the above surgical procedure(s). We have discussed the potential benefits and risks of the procedure, side effects of the proposed treatment, the likelihood of the patient achieving the goals of the procedure, and any potential problems that might occur during the procedure or recuperation. Informed consent has been obtained.  Description of procedure:  The patient was taken to the operating room and general anesthesia was induced.  The patient was placed in the dorsal lithotomy position, prepped and draped in the usual sterile fashion, and preoperative antibiotics were administered. A preoperative time-out was performed.   A 21 French cystoscope was advanced per urethra into the bladder.  Upon doing this, I pushed the stent back into the bladder.  Attention was turned to the left side.  Scout imaging showed a partially extruded stent with partial coil in the proximal ureter.  There is no significant encrustation appreciated.  I advanced an open-ended ureteral catheter alongside of the stent and injected a  slurry of contrast and lidocaine jelly, approximately 5 cc along the side of the stent.  Then the stent graspers and was able to pull out the stent fairly easily without difficulty.  A final retrograde pyelogram showed no obvious obstructing stone fragments, no filling defects and there was prompt excretion of the contrast material consistent with no obstructing fragments or significant edema.  As such, elected not to replace the stent.  Bladder was drained.  She was then cleaned and dried, repositioned in supine position, reversed from anesthesia, and taken to the PACU in stable condition.  Plan: Will plan for renal ultrasound in 4 weeks and call her with the results.  Hollice Espy, M.D.

## 2022-05-14 NOTE — Progress Notes (Signed)
   05/14/22  CC:  Chief Complaint  Patient presents with   Cysto Stent Removal    HPI: 27-year-old female who with retained left ureteral stent who presents today for stent removal.  Notably, this was placed during her most recent pregnancy for presumed left ureteral stone.  She was scheduled for ureteroscopy however prior to this, she underwent a CT stone protocol which showed no obvious ureteral stone along send and no significant encrustation.  As such, surgery was canceled and she was gradual for stent removal in the office.  Unfortunately, she has missed several appointments to have her stent removed.  In the interim, she presented last week with concerns for difficulty urinating.  Her UA appeared suspicious consistent with known retained stent but ultimately the urine culture was negative.  She never picked up her antibiotics.  She did receive 1 dose of p.o. Levaquin today prior to stent removal.  Blood pressure (!) 142/92, pulse 94, height 5' 2" (1.575 m), weight 160 lb (72.6 kg), last menstrual period 07/14/2021, unknown if currently breastfeeding. NED. A&Ox3.   No respiratory distress   Abd soft, NT, ND Normal external genitalia with patent urethral meatus  Cystoscopy/ Stent removal procedure  Patient identification was confirmed, informed consent was obtained, and patient was prepped using Betadine solution.  Lidocaine jelly was administered per urethral meatus.    Preoperative abx where received prior to procedure.    Procedure: - Flexible cystoscope introduced, without any difficulty.   - Thorough search of the bladder revealed:    normal urethral meatus  Stent seen emanating from left ureteral orifice, grasped with stent graspers and pulled approximately two thirds the way out however at this point in time, the stent did not come out easily.  I tried several maneuvers including amputating the end of the stent and attempted to cannulate with the wire unsuccessfully.   As such, the decision was made to proceed to the operating room to address the retained stent.  The stent was amputated and pushed back up into her bladder for the short-term   Assessment/ Plan:  1.  Retained/encrusted left ureteral stent-unsuccessful partially removed stent in the office today, likely secondary to encrustation of the proximal curl.  I recommended proceeding to the operating room for left ureteroscopic stone removal, possible laser lithotripsy/ left retrograde if deemed necessary to address any encrustation.  She understands that she will also possibly need ureteral stent replacement depending on the presence or absence of any ureteral trauma from the above.  Risk and benefits reviewed.  She is essentially currently n.p.o., last had a tiny sip of water.  Discussed case with our charge nurse, will add on for lunchtime today.  Remain NPO.  All questions were answered.   Lyrique Hakim, MD 

## 2022-05-14 NOTE — Telephone Encounter (Signed)
Spoke with patient and advised to come to the office still per Dr Erlene Quan.

## 2022-05-14 NOTE — Progress Notes (Signed)
Surgical Physician Order Form [facility] [place]  * Scheduling expectation : ASAP  *Length of Case:   *Clearance needed: no  *Anticoagulation Instructions: N/A  *Aspirin Instructions: N/A  *Post-op visit Date/Instructions:   TBD  *Diagnosis:  Retained Stent  *Procedure:  possible left ureteroscopy with laser litho, possible stent exchange, left retrograde pyelogram      Additional orders: N/A  -Admit type: OUTpatient  -Anesthesia: General  -VTE Prophylaxis Standing Order SCD's       Other:   -Standing Lab Orders Per Anesthesia    Lab other: None  -Standing Test orders EKG/Chest x-ray per Anesthesia       Test other:   - Medications:  Ancef 2gm IV  -Other orders:  N/A

## 2022-05-14 NOTE — Anesthesia Preprocedure Evaluation (Signed)
Anesthesia Evaluation  Patient identified by MRN, date of birth, ID band Patient awake    Reviewed: Allergy & Precautions, NPO status , Patient's Chart, lab work & pertinent test results  History of Anesthesia Complications (+) history of anesthetic complications  Airway Mallampati: III  TM Distance: >3 FB Neck ROM: full    Dental  (+) Chipped, Poor Dentition, Missing, Dental Advidsory Given   Pulmonary asthma , Current Smoker and Patient abstained from smoking.   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 Cherrie Gauze clinic where a piece of stent was retained. Patient states she is in a lot of pain. States she ate some this morning, and feels nauseous sitting in bed. Patient admitted to recent methamphetamine use. Discussed with her the increased risks of adverse effects under anesthesia. Patient stated she understood and agreed to proceed in this emergent case.  Past Medical History: 11/07/2010: Abuse, drug or alcohol ([place])     Comment:  Overview:  A.  Never IVDA.   prescription drugs, THC,               methamphetamines.   B.   Denies any since March 2013.  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  No prior ED visits or hospitalization  No date: Asthma 10/25/2014: Calculus of kidney 01/01/2012: Chronic cough     Comment:  Overview:  A.  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  Depression, PTSA, mood disorder NOS B.                Psychiatrist:  Helene Kelp C.  Therapist:  Marjie Skiff  [id number]: Renal colic No date: Renal disorder No date: Restless leg syndrome  Past Surgical History: 12/04/2014: COLONOSCOPY WITH PROPOFOL; N/A     Comment:  Procedure: COLONOSCOPY WITH PROPOFOL;  Surgeon: Lucilla Lame, MD;  Location: [place];  Service:               Endoscopy;  Laterality: N/A; No date: DENTAL SURGERY 12/04/2014: ESOPHAGOGASTRODUODENOSCOPY (EGD) WITH PROPOFOL; N/A     Comment:  Procedure: ESOPHAGOGASTRODUODENOSCOPY (EGD) WITH               PROPOFOL;  Surgeon: Lucilla Lame, MD;  [facility];  Service: Endoscopy;  Laterality: N/A; No date: kidney stent No date: kidney stones; Left No date: LITHOTRIPSY  BMI    Body Mass Index: 27.98 kg/m      Reproductive/Obstetrics negative OB ROS                             Anesthesia Physical Anesthesia Plan  ASA: 3 and emergent  Anesthesia Plan: General ETT   Post-op Pain Management:    Induction: Intravenous  PONV Risk Score and Plan: Ondansetron, Dexamethasone, Midazolam and Treatment may vary due to age or medical condition  Airway Management Planned: Oral ETT  Additional Equipment:   Intra-op Plan:   Post-operative Plan: Extubation in OR  Informed Consent: I have reviewed the patients History and Physical, chart, labs and discussed the procedure including the risks, benefits and alternatives for the proposed anesthesia with the patient or authorized representative who has indicated  his/her understanding and acceptance.     Dental Advisory Given  Plan Discussed with: Anesthesiologist, CRNA and Surgeon  Anesthesia Plan Comments: (Patient consented for risks of anesthesia including but not limited to:  - adverse reactions to medications - damage to eyes, teeth, lips or other oral mucosa - nerve damage due to positioning  - sore throat or hoarseness - Damage to heart, brain, nerves, lungs, other parts of body or loss of life  Patient voiced understanding.)       Anesthesia Quick Evaluation

## 2022-05-14 NOTE — H&P (View-Only) (Signed)
   05/14/22  CC:  Chief Complaint  Patient presents with   Cysto Stent Removal    HPI: 27 year old female who with retained left ureteral stent who presents today for stent removal.  Notably, this was placed during her most recent pregnancy for presumed left ureteral stone.  She was scheduled for ureteroscopy however prior to this, she underwent a CT stone protocol which showed no obvious ureteral stone along send and no significant encrustation.  As such, surgery was canceled and she was gradual for stent removal in the office.  Unfortunately, she has missed several appointments to have her stent removed.  In the interim, she presented last week with concerns for difficulty urinating.  Her UA appeared suspicious consistent with known retained stent but ultimately the urine culture was negative.  She never picked up her antibiotics.  She did receive 1 dose of p.o. Levaquin today prior to stent removal.  Blood pressure (!) 142/92, pulse 94, height 5\' 2"  (1.575 m), weight 160 lb (72.6 kg), last menstrual period 07/14/2021, unknown if currently breastfeeding. NED. A&Ox3.   No respiratory distress   Abd soft, NT, ND Normal external genitalia with patent urethral meatus  Cystoscopy/ Stent removal procedure  Patient identification was confirmed, informed consent was obtained, and patient was prepped using Betadine solution.  Lidocaine jelly was administered per urethral meatus.    Preoperative abx where received prior to procedure.    Procedure: - Flexible cystoscope introduced, without any difficulty.   - Thorough search of the bladder revealed:    normal urethral meatus  Stent seen emanating from left ureteral orifice, grasped with stent graspers and pulled approximately two thirds the way out however at this point in time, the stent did not come out easily.  I tried several maneuvers including amputating the end of the stent and attempted to cannulate with the wire unsuccessfully.   As such, the decision was made to proceed to the operating room to address the retained stent.  The stent was amputated and pushed back up into her bladder for the short-term   Assessment/ Plan:  1.  Retained/encrusted left ureteral stent-unsuccessful partially removed stent in the office today, likely secondary to encrustation of the proximal curl.  I recommended proceeding to the operating room for left ureteroscopic stone removal, possible laser lithotripsy/ left retrograde if deemed necessary to address any encrustation.  She understands that she will also possibly need ureteral stent replacement depending on the presence or absence of any ureteral trauma from the above.  Risk and benefits reviewed.  She is essentially currently n.p.o., last had a tiny sip of water.  Discussed case with our charge nurse, will add on for lunchtime today.  Remain NPO.  All questions were answered.   Hollice Espy, MD

## 2022-05-14 NOTE — Interval H&P Note (Signed)
History and Physical Interval Note:  05/14/2022 11:18 AM  Kelsey Serrano  has presented today for surgery, with the diagnosis of retained left ureteral stent.  The various methods of treatment have been discussed with the patient and family. After consideration of risks, benefits and other options for treatment, the patient has consented to  Procedure(s): CYSTOSCOPY WITH RETROGRADE PYELOGRAM/URETEROSCOPY/HOLMIUM LASER/STENT EXCHANGE (Left) as a surgical intervention.  The patient's history has been reviewed, patient examined, no change in status, stable for surgery.  I have reviewed the patient's chart and labs.  Questions were answered to the patient's satisfaction.    RRR CTAB   Hollice Espy

## 2022-05-14 NOTE — Transfer of Care (Signed)
Immediate Anesthesia Transfer of Care Note  Patient: Kelsey [place]  Procedure(s) Performed: Procedure(s): CYSTOSCOPY WITH RETROGRADE PYELOGRAM/EXPLANT OF RETAINED STENT (Left)  Patient Location: PACU  Anesthesia Type:General  Level of Consciousness: sedated  Airway & Oxygen Therapy: Patient Spontanous Breathing and Patient connected to face mask oxygen  Post-op Assessment: Report given to RN and Post -op Vital signs reviewed and stable  Post vital signs: Reviewed and stable  Last Vitals:  Vitals:   05/14/22 1041 05/14/22 1212  BP: (!) 141/93 131/89  Pulse: (!) 106 (!) 118  Resp: 16 (!) 24  Temp: 36.6 C 36.7 C  SpO2: 123XX123 [id number]    Complications: No apparent anesthesia complications

## 2022-05-14 NOTE — Discharge Instructions (Signed)

## 2022-05-14 NOTE — Anesthesia Procedure Notes (Signed)
Procedure Name: Intubation Date/Time: 05/14/2022 11:47 AM  Performed by: Doreen Salvage, CRNAPre-anesthesia Checklist: Patient identified, Emergency Drugs available, Suction available and Patient being monitored Patient Re-evaluated:Patient Re-evaluated prior to induction Oxygen Delivery Method: Circle system utilized Preoxygenation: Pre-oxygenation with 100% oxygen Induction Type: IV induction, Cricoid Pressure applied and Rapid sequence Ventilation: Mask ventilation without difficulty Laryngoscope Size: Mac and 3 Grade View: Grade II Tube type: Oral Tube size: 7.0 mm Number of attempts: 1 Airway Equipment and Method: Stylet Placement Confirmation: ETT inserted through vocal cords under direct vision, positive ETCO2 and breath sounds checked- equal and bilateral Secured at: 21 cm Tube secured with: Tape Dental Injury: Teeth and Oropharynx as per pre-operative assessment

## 2022-05-15 ENCOUNTER — Encounter: Payer: Self-pay | Admitting: Urology

## 2022-05-15 NOTE — Anesthesia Postprocedure Evaluation (Signed)
Anesthesia Post Note  Patient: Kelsey Serrano  Procedure(s) Performed: CYSTOSCOPY WITH RETROGRADE PYELOGRAM/EXPLANT OF RETAINED STENT (Left: Ureter)  Patient location during evaluation: PACU Anesthesia Type: General Level of consciousness: awake and alert Pain management: pain level controlled Vital Signs Assessment: post-procedure vital signs reviewed and stable Respiratory status: spontaneous breathing, nonlabored ventilation, respiratory function stable and patient connected to nasal cannula oxygen Cardiovascular status: blood pressure returned to baseline and stable Postop Assessment: no apparent nausea or vomiting Anesthetic complications: no   No notable events documented.   Last Vitals:  Vitals:   05/14/22 1245 05/14/22 1308  BP: 129/82 131/74  Pulse: 89 86  Resp: 18 17  Temp: 36.5 C 36.4 C  SpO2: 99% 90%    Last Pain:  Vitals:   05/14/22 1308  TempSrc: Temporal  PainSc: 0-No pain                 Dimas Millin

## 2022-05-20 ENCOUNTER — Encounter: Payer: Medicaid Other | Admitting: Urology

## 2022-10-03 ENCOUNTER — Ambulatory Visit: Payer: Medicaid Other | Admitting: Urology

## 2022-10-03 ENCOUNTER — Other Ambulatory Visit: Payer: Self-pay

## 2022-10-03 DIAGNOSIS — Z87442 Personal history of urinary calculi: Secondary | ICD-10-CM

## 2022-10-16 ENCOUNTER — Ambulatory Visit
Admission: RE | Admit: 2022-10-16 | Discharge: 2022-10-16 | Disposition: A | Discharge status: Home / Self Care | Payer: Medicaid Other | Source: Ambulatory Visit | Attending: Urology | Admitting: Urology

## 2022-10-16 DIAGNOSIS — Z87442 Personal history of urinary calculi: Secondary | ICD-10-CM | POA: Diagnosis present

## 2022-10-22 ENCOUNTER — Ambulatory Visit: Payer: Medicaid Other | Admitting: Urology

## 2023-01-07 ENCOUNTER — Telehealth: Payer: Medicaid Other | Admitting: Physician Assistant

## 2023-01-07 DIAGNOSIS — R34 Anuria and oliguria: Secondary | ICD-10-CM

## 2023-01-07 NOTE — Progress Notes (Signed)
Because you are noting you cannot pass urine, I feel your condition warrants further evaluation and I recommend that you be seen in a face to face visit ASAP at nearest ER.   NOTE: There will be NO CHARGE for this eVisit   If you are having a true medical emergency please call 911.      For an urgent face to face visit, [place] has eight urgent care centers for your convenience:   NEW!! [facility] Health Urgent Care Center at [facility] Get Driving Directions [phone] [address] [place], [address]    [facility] Health Urgent Care Center at [facility] Get Driving Directions [phone] [address] [place], [place] [address]   [facility] Health Urgent Care Center [facility]) Get Driving Directions [phone] [address] [place], [place] [address]  [facility] Health Urgent Care Center [facility]) Get Driving Directions [phone] [address] [place],  [place]  [address]  [facility] Health Urgent Care Center [facility] - at [employer]  [phone] [place],  [place] [address]   [facility] Health Urgent Care at [facility] Get Driving Directions [phone] [address] [place], [place] [address]   [facility] at [facility] Get Driving Directions  [phone] [address] [place], [place] [address]   [facility] Health Urgent Care at [facility] Directions [phone] [address], [address] [place], [place] [address]  Your MyChart E-visit questionnaire answers were reviewed by a board certified advanced clinical practitioner to complete your personal care plan based on your specific symptoms.  Thank you for using e-Visits.

## 2023-03-04 NOTE — L&D Delivery Note (Signed)
 Delivery Note  Date of delivery: 08/07/2023 Estimated Date of Delivery: 08/28/23 Patient's last menstrual period was 12/23/2022 (approximate). EGA: [redacted]w[redacted]d  Delivery Note At 10:12 PM a viable female was delivered via Vaginal, Spontaneous (Presentation:  OA).  APGAR: 8, 9; weight 5 lb 7.5 oz (2480 g).  Placenta status: Spontaneous,  .  Cord: 3 vessels with the following complications: none.    First Stage: Labor onset: unknown Induction: Cytotec  Pitocin  Analgesia /Anesthesia intrapartum: Epidural AROM at 2123  Kelsey Serrano presented to L&D for IOL. She was induced with cytotec  and pitocin . Epidural placed for pain relief.   Second Stage: Complete dilation at 2205 Onset of pushing at 2205 FHR second stage cat I Delivery at 2212 on 08/07/2023  She progressed to complete and had a spontaneous vaginal birth of a live female over an intact perineum. The fetal head was delivered in OA position with restitution to LOA. No nuchal cord. Anterior then posterior shoulders delivered spontaneously with minimal assistance. Baby placed on mom's abdomen and attended to by transition RN. Cord clamped and cut after 1+ min by FOB. Cord blood obtained for newborn labs.  Third Stage: Placenta delivered intact with 3VC at 2220 Placenta disposition: pathology Uterine tone firm / bleeding min TXA and IV pitocin  given for hemorrhage prophylaxis  Anesthesia:  Epidural Episiotomy: None Lacerations:  none Suture Repair: n/a Est. Blood Loss (mL):  100  Complications: none  Mom to postpartum.  Baby to Couplet care / Skin to Skin.  Newborn: Birth Weight: 5 lb 7.5 oz (2480 g)  Apgar Scores: 8, 9 Feeding planned: bottle   Aron Lard, CNM 08/07/2023 10:38 PM

## 2023-03-06 DIAGNOSIS — O0993 Supervision of high risk pregnancy, unspecified, third trimester: Secondary | ICD-10-CM | POA: Insufficient documentation

## 2023-03-06 LAB — OB RESULTS CONSOLE VARICELLA ZOSTER ANTIBODY, IGG: Varicella: IMMUNE

## 2023-03-06 LAB — OB RESULTS CONSOLE RUBELLA ANTIBODY, IGM: Rubella: IMMUNE

## 2023-03-06 LAB — OB RESULTS CONSOLE HEPATITIS B SURFACE ANTIGEN: Hepatitis B Surface Ag: NEGATIVE

## 2023-05-02 DIAGNOSIS — C801 Malignant (primary) neoplasm, unspecified: Secondary | ICD-10-CM

## 2023-05-02 HISTORY — DX: Malignant (primary) neoplasm, unspecified: C80.1

## 2023-05-14 DIAGNOSIS — C73 Malignant neoplasm of thyroid gland: Secondary | ICD-10-CM | POA: Insufficient documentation

## 2023-06-11 DIAGNOSIS — F1911 Other psychoactive substance abuse, in remission: Secondary | ICD-10-CM | POA: Insufficient documentation

## 2023-06-11 DIAGNOSIS — O99210 Obesity complicating pregnancy, unspecified trimester: Secondary | ICD-10-CM | POA: Insufficient documentation

## 2023-06-12 LAB — OB RESULTS CONSOLE RPR: RPR: NONREACTIVE

## 2023-06-12 LAB — OB RESULTS CONSOLE HIV ANTIBODY (ROUTINE TESTING): HIV: NONREACTIVE

## 2023-07-29 ENCOUNTER — Telehealth: Payer: Self-pay

## 2023-07-29 ENCOUNTER — Encounter: Payer: Self-pay | Admitting: Obstetrics and Gynecology

## 2023-07-29 ENCOUNTER — Other Ambulatory Visit: Payer: Self-pay | Admitting: Obstetrics and Gynecology

## 2023-07-29 DIAGNOSIS — O0993 Supervision of high risk pregnancy, unspecified, third trimester: Secondary | ICD-10-CM

## 2023-07-29 DIAGNOSIS — C73 Malignant neoplasm of thyroid gland: Secondary | ICD-10-CM

## 2023-07-31 ENCOUNTER — Other Ambulatory Visit

## 2023-07-31 ENCOUNTER — Telehealth: Payer: Self-pay

## 2023-07-31 ENCOUNTER — Ambulatory Visit

## 2023-08-04 ENCOUNTER — Observation Stay
Admission: EM | Admit: 2023-08-04 | Discharge: 2023-08-04 | Disposition: A | Discharge status: Home / Self Care | Attending: Obstetrics and Gynecology | Admitting: Obstetrics and Gynecology

## 2023-08-04 ENCOUNTER — Other Ambulatory Visit: Payer: Self-pay

## 2023-08-04 ENCOUNTER — Encounter: Payer: Self-pay | Admitting: Obstetrics and Gynecology

## 2023-08-04 DIAGNOSIS — Z79899 Other long term (current) drug therapy: Secondary | ICD-10-CM | POA: Insufficient documentation

## 2023-08-04 DIAGNOSIS — Z85850 Personal history of malignant neoplasm of thyroid: Secondary | ICD-10-CM | POA: Insufficient documentation

## 2023-08-04 DIAGNOSIS — Z87891 Personal history of nicotine dependence: Secondary | ICD-10-CM | POA: Insufficient documentation

## 2023-08-04 DIAGNOSIS — Z3A36 36 weeks gestation of pregnancy: Secondary | ICD-10-CM | POA: Insufficient documentation

## 2023-08-04 DIAGNOSIS — O99283 Endocrine, nutritional and metabolic diseases complicating pregnancy, third trimester: Secondary | ICD-10-CM | POA: Insufficient documentation

## 2023-08-04 DIAGNOSIS — J45909 Unspecified asthma, uncomplicated: Secondary | ICD-10-CM | POA: Diagnosis not present

## 2023-08-04 DIAGNOSIS — E039 Hypothyroidism, unspecified: Secondary | ICD-10-CM | POA: Insufficient documentation

## 2023-08-04 DIAGNOSIS — O99513 Diseases of the respiratory system complicating pregnancy, third trimester: Secondary | ICD-10-CM | POA: Insufficient documentation

## 2023-08-04 DIAGNOSIS — O368330 Maternal care for abnormalities of the fetal heart rate or rhythm, third trimester, not applicable or unspecified: Principal | ICD-10-CM | POA: Insufficient documentation

## 2023-08-04 DIAGNOSIS — O288 Other abnormal findings on antenatal screening of mother: Principal | ICD-10-CM

## 2023-08-04 LAB — OB RESULTS CONSOLE GC/CHLAMYDIA
Chlamydia: NEGATIVE
Neisseria Gonorrhea: NEGATIVE

## 2023-08-04 LAB — OB RESULTS CONSOLE GBS: GBS: NEGATIVE

## 2023-08-04 MED ORDER — LACTATED RINGERS IV BOLUS: 1000.0000 mL | Freq: Once | INTRAVENOUS | Status: DC

## 2023-08-04 NOTE — OB Triage Note (Signed)
 Pt is a 28yo [id number], 36w 4d. She arrived to the unit to have extended monitoring after a prenatal apt where they couldn't determine baseline. She denies vaginal bleeding, reports positive fetal movement, and denies contractions. VS stable, monitors applied and assessing.   Initial FHT 125 at 1702.

## 2023-08-04 NOTE — OB Triage Note (Signed)
 Discharge instructions provided to pt. Pt verbalizes understanding. Vaginal bleeding and discharge, contractions, and fetal movement reviewed by RN. Follow-up care reviewed. Pt discharged home by self and understands that she has an IOL scheduled for Friday at 0800.

## 2023-08-04 NOTE — Discharge Summary (Signed)
 Kelsey Serrano is a 28 y.o. female. She is at [redacted]w[redacted]d gestation. Patient's last menstrual period was 12/23/2022 (approximate). Estimated Date of Delivery: 08/28/23  Prenatal care site: [facility]  Chief complaint: Non Reactive Stress Test    HPI: Timmi was sent to L&D triage from [facility] for further fetal surveillance due to non reactive fetal stress test in office today (08/04/2023). She denies vaginal bleeding, leakage of fluid and labor contractions. She reports mild to moderate contractions in her back. Endorses positive fetal movement.    Factors complicating pregnancy: Thyroid Cancer  Fetal Growth Restriction GERD Nausea/Vomiting Obesity-Pregravid BMI: 30.25 Asthma PTSD, Anxiety, and Depression Substance Abuse- H/o methamphetamine, Marijuana, and prescription drug use Social concerns- Does not have custody of her five children H/o PPH  S: Resting comfortably. No contractions, no vaginal bleeding and no leakage of fluid.      Reports active fetal movement.   Maternal Medical History:  Past Medical Hx:  has a past medical history of Abuse, drug or alcohol (HCC) (11/07/2010), Airway hyperreactivity (11/07/2010), Asthma, Calculus of kidney (10/25/2014), Chronic cough (01/01/2012), Complication of anesthesia, Current smoker (04/12/2013), D (diarrhea) (10/25/2014), Depression, major, recurrent (HCC) (10/02/2011), Hypothyroidism, Impacted tooth, Neurosis, posttraumatic (10/02/2011), Renal colic (10/25/2014), Renal disorder, Restless leg syndrome, and Thyroid Cancer (HCC) (05/2023).    Past Surgical Hx:  has a past surgical history that includes Lithotripsy; kidney stones (Left); kidney stent; Colonoscopy with propofol  (N/A, 12/04/2014); Esophagogastroduodenoscopy (egd) with propofol  (N/A, 12/04/2014); Dental surgery; Cystoscopy w/ ureteral stent placement (Left, 01/31/2022); and Cystoscopy/ureteroscopy/holmium laser/stent placement (Left, 05/14/2022).   Allergies   Allergen Reactions   Augmentin  [Amoxicillin -Pot Clavulanate] Nausea And Vomiting   Estrogens Nausea And Vomiting    Pt states she is allergic to high doses of estrogens.   Lavender Oil Hives   Zyrtec [Cetirizine] Nausea And Vomiting     Prior to Admission medications   Medication Sig Start Date End Date Taking? Authorizing Provider  albuterol  (VENTOLIN  HFA) 108 (90 Base) MCG/ACT inhaler Inhale into the lungs. 11/04/20  Yes [provider]  metoCLOPramide  (REGLAN ) 5 MG tablet Take 5 mg by mouth 3 (three) times daily as needed for nausea.   Yes [provider]  ondansetron  (ZOFRAN ) 4 MG tablet Take 4 mg by mouth every 8 (eight) hours as needed for nausea or vomiting.   Yes [provider]  pantoprazole  (PROTONIX ) 40 MG tablet Take 40 mg by mouth daily.   Yes [provider]  Prenatal Vit-Fe Fumarate-FA (MULTIVITAMIN-PRENATAL) 27-0.8 MG TABS tablet Take 1 tablet by mouth daily at 12 noon.   Yes [provider]    Social History: She  reports that she has quit smoking. Her smoking use included cigarettes. She has a 7 pack-year smoking history. She has been exposed to tobacco smoke. She has never used smokeless tobacco. She reports current drug use. Drugs: Marijuana and Amphetamines. She reports that she does not drink alcohol.  Family History: family history includes Diabetes in her mother.   Review of Systems:  Review of Systems  Constitutional: Negative.   Gastrointestinal:  Positive for abdominal pain (ocassional mild/moderate contractions felt in back).  Genitourinary: Negative.   Psychiatric/Behavioral: Negative.      O:  LMP 12/23/2022 (Approximate)  No results found for this or any previous visit (from the past 48 hours).   Constitutional: NAD, AAOx3  HE/ENT: extraocular movements grossly intact, moist mucous membranes CV: RRR PULM: nl respiratory effort, CTABL Abd: gravid, non-tender, non-distended, soft  Ext: Non-tender,  Nonedmeatous  Psych: mood appropriate, speech normal Pelvic : Deferred SVE: Deferred   NST (08/04/2023) Baseline: 125 bpm Variability: moderate Accels: Present Decels: none Toco: occasional x 2  Category: I INDICATIONS: Non Reactive Stress Test  RESULTS:  A NST procedure was performed with FHR monitoring and a normal baseline established, appropriate time of 20-40 minutes of evaluation, and accels >2 seen w 15x15 characteristics.  Results show a REACTIVE NST.   Assessment: 28 y.o. [redacted]w[redacted]d here for antenatal surveillance during pregnancy.  Principle diagnosis: Non Reactive Stress Test   Plan: Antenatal Surveillance  Labor: Not present. US  and toco placed on abdomen. Abdomen soft/nontender. No pain elicited during palpation. FHR tracing observed for approximately 27 minutes. Occasional x 2 contractions noted. Patient described contractions as mild/moderate.  LR IV Fluid bolus offered. Patient declined.  IOL scheduled for 08/07/2023  2. Non Reactive NST Reactive NST  Fetal Wellbeing: Reassuring Cat 1 tracing. US  OB (07/31/2023)--> WNL  BPP 8/8 (07/31/2023)  - D/c home stable, strict return precautions reviewed, follow-up as needed.   ----- Gulianna Hornsby, CNM Certified Nurse Midwife [place]  [facility]

## 2023-08-07 ENCOUNTER — Inpatient Hospital Stay
Admission: RE | Admit: 2023-08-07 | Discharge: 2023-08-09 | DRG: 807 | Disposition: A | Discharge status: Home / Self Care | Attending: Certified Nurse Midwife | Admitting: Certified Nurse Midwife

## 2023-08-07 ENCOUNTER — Encounter: Payer: Self-pay | Admitting: Obstetrics and Gynecology

## 2023-08-07 ENCOUNTER — Other Ambulatory Visit: Payer: Self-pay

## 2023-08-07 ENCOUNTER — Inpatient Hospital Stay: Admitting: Anesthesiology

## 2023-08-07 DIAGNOSIS — O99210 Obesity complicating pregnancy, unspecified trimester: Secondary | ICD-10-CM | POA: Diagnosis present

## 2023-08-07 DIAGNOSIS — F419 Anxiety disorder, unspecified: Secondary | ICD-10-CM | POA: Diagnosis present

## 2023-08-07 DIAGNOSIS — O99214 Obesity complicating childbirth: Secondary | ICD-10-CM | POA: Diagnosis present

## 2023-08-07 DIAGNOSIS — O365930 Maternal care for other known or suspected poor fetal growth, third trimester, not applicable or unspecified: Secondary | ICD-10-CM | POA: Diagnosis present

## 2023-08-07 DIAGNOSIS — F1911 Other psychoactive substance abuse, in remission: Secondary | ICD-10-CM | POA: Diagnosis present

## 2023-08-07 DIAGNOSIS — J45909 Unspecified asthma, uncomplicated: Secondary | ICD-10-CM | POA: Diagnosis present

## 2023-08-07 DIAGNOSIS — O9952 Diseases of the respiratory system complicating childbirth: Secondary | ICD-10-CM | POA: Diagnosis present

## 2023-08-07 DIAGNOSIS — Z833 Family history of diabetes mellitus: Secondary | ICD-10-CM

## 2023-08-07 DIAGNOSIS — F1290 Cannabis use, unspecified, uncomplicated: Secondary | ICD-10-CM | POA: Diagnosis present

## 2023-08-07 DIAGNOSIS — Z8759 Personal history of other complications of pregnancy, childbirth and the puerperium: Secondary | ICD-10-CM

## 2023-08-07 DIAGNOSIS — C73 Malignant neoplasm of thyroid gland: Secondary | ICD-10-CM | POA: Diagnosis present

## 2023-08-07 DIAGNOSIS — Z3A37 37 weeks gestation of pregnancy: Secondary | ICD-10-CM

## 2023-08-07 DIAGNOSIS — O9A12 Malignant neoplasm complicating childbirth: Secondary | ICD-10-CM | POA: Diagnosis present

## 2023-08-07 DIAGNOSIS — O365990 Maternal care for other known or suspected poor fetal growth, unspecified trimester, not applicable or unspecified: Secondary | ICD-10-CM | POA: Diagnosis present

## 2023-08-07 DIAGNOSIS — O99324 Drug use complicating childbirth: Secondary | ICD-10-CM | POA: Diagnosis present

## 2023-08-07 DIAGNOSIS — Z87891 Personal history of nicotine dependence: Secondary | ICD-10-CM

## 2023-08-07 DIAGNOSIS — Z3490 Encounter for supervision of normal pregnancy, unspecified, unspecified trimester: Secondary | ICD-10-CM | POA: Diagnosis present

## 2023-08-07 DIAGNOSIS — R825 Elevated urine levels of drugs, medicaments and biological substances: Secondary | ICD-10-CM | POA: Diagnosis present

## 2023-08-07 LAB — CBC
HCT: 37.3 % (ref 36.0–46.0)
Hemoglobin: 13 g/dL (ref 12.0–15.0)
MCH: 29.5 pg (ref 26.0–34.0)
MCHC: 34.9 g/dL (ref 30.0–36.0)
MCV: 84.8 fL (ref 80.0–100.0)
Platelets: 203 10*3/uL (ref 150–400)
RBC: 4.4 MIL/uL (ref 3.87–5.11)
RDW: 13.2 % (ref 11.5–15.5)
WBC: 12.3 10*3/uL — ABNORMAL HIGH (ref 4.0–10.5)
nRBC: 0 % (ref 0.0–0.2)

## 2023-08-07 LAB — TYPE AND SCREEN
ABO/RH(D): O POS
Antibody Screen: NEGATIVE

## 2023-08-07 LAB — URINE DRUG SCREEN, QUALITATIVE (ARMC ONLY)
Amphetamines, Ur Screen: NOT DETECTED
Barbiturates, Ur Screen: NOT DETECTED
Benzodiazepine, Ur Scrn: NOT DETECTED
Cannabinoid 50 Ng, Ur ~~LOC~~: POSITIVE — AB
Cocaine Metabolite,Ur ~~LOC~~: NOT DETECTED
MDMA (Ecstasy)Ur Screen: NOT DETECTED
Methadone Scn, Ur: NOT DETECTED
Opiate, Ur Screen: NOT DETECTED
Phencyclidine (PCP) Ur S: NOT DETECTED
Tricyclic, Ur Screen: NOT DETECTED

## 2023-08-07 MED ORDER — OXYCODONE-ACETAMINOPHEN 5-325 MG PO TABS: 2.0000 | ORAL_TABLET | ORAL | Status: DC | PRN

## 2023-08-07 MED ORDER — SOD CITRATE-CITRIC ACID 500-334 MG/5ML PO SOLN: 30.0000 mL | ORAL | Status: DC | PRN

## 2023-08-07 MED ORDER — FENTANYL-BUPIVACAINE-NACL 0.5-0.125-0.9 MG/250ML-% EP SOLN
EPIDURAL | Status: AC
  Filled 2023-08-07: qty 250

## 2023-08-07 MED ORDER — DIPHENHYDRAMINE HCL 50 MG/ML IJ SOLN: 12.5000 mg | INTRAMUSCULAR | Status: DC | PRN

## 2023-08-07 MED ORDER — PHENYLEPHRINE 80 MCG/ML (10ML) SYRINGE FOR IV PUSH (FOR BLOOD PRESSURE SUPPORT): 80.0000 ug | PREFILLED_SYRINGE | INTRAVENOUS | Status: DC | PRN

## 2023-08-07 MED ORDER — EPHEDRINE 5 MG/ML INJ: 10.0000 mg | INTRAVENOUS | Status: DC | PRN

## 2023-08-07 MED ORDER — LIDOCAINE HCL (PF) 1 % IJ SOLN: 30.0000 mL | INTRAMUSCULAR | Status: DC | PRN

## 2023-08-07 MED ORDER — ACETAMINOPHEN 325 MG PO TABS
650.0000 mg | ORAL_TABLET | ORAL | Status: DC | PRN
  Filled 2023-08-07: qty 2

## 2023-08-07 MED ORDER — LACTATED RINGERS IV SOLN: 500.0000 mL | Freq: Once | INTRAVENOUS | Status: DC

## 2023-08-07 MED ORDER — TRANEXAMIC ACID-NACL 1000-0.7 MG/100ML-% IV SOLN
INTRAVENOUS | Status: AC
  Administered 2023-08-07: 10 mg
  Filled 2023-08-07: qty 100

## 2023-08-07 MED ORDER — LACTATED RINGERS IV SOLN: 500.0000 mL | INTRAVENOUS | Status: DC | PRN

## 2023-08-07 MED ORDER — OXYCODONE-ACETAMINOPHEN 5-325 MG PO TABS: 1.0000 | ORAL_TABLET | ORAL | Status: DC | PRN

## 2023-08-07 MED ORDER — FENTANYL-BUPIVACAINE-NACL 0.5-0.125-0.9 MG/250ML-% EP SOLN
12.0000 mL/h | EPIDURAL | Status: DC | PRN
  Administered 2023-08-07: 12 mL/h via EPIDURAL

## 2023-08-07 MED ORDER — OXYTOCIN-SODIUM CHLORIDE 30-0.9 UT/500ML-% IV SOLN: 2.5000 [IU]/h | INTRAVENOUS | Status: DC

## 2023-08-07 MED ORDER — MISOPROSTOL 25 MCG QUARTER TABLET
25.0000 ug | ORAL_TABLET | ORAL | Status: AC
  Administered 2023-08-07 (×2): 25 ug via ORAL
  Filled 2023-08-07 (×2): qty 1

## 2023-08-07 MED ORDER — AMMONIA AROMATIC IN INHA
RESPIRATORY_TRACT | Status: AC
  Filled 2023-08-07: qty 10

## 2023-08-07 MED ORDER — LIDOCAINE-EPINEPHRINE (PF) 1.5 %-1:200000 IJ SOLN
INTRAMUSCULAR | Status: DC | PRN
  Administered 2023-08-07: 2 mL via INTRADERMAL

## 2023-08-07 MED ORDER — LIDOCAINE HCL (PF) 1 % IJ SOLN
INTRAMUSCULAR | Status: AC
  Filled 2023-08-07: qty 30

## 2023-08-07 MED ORDER — MISOPROSTOL 25 MCG QUARTER TABLET
25.0000 ug | ORAL_TABLET | ORAL | Status: AC
  Administered 2023-08-07 (×2): 25 ug via VAGINAL
  Filled 2023-08-07 (×2): qty 1

## 2023-08-07 MED ORDER — BUPIVACAINE HCL (PF) 0.25 % IJ SOLN
INTRAMUSCULAR | Status: DC | PRN
  Administered 2023-08-07 (×2): 4 mL via EPIDURAL

## 2023-08-07 MED ORDER — OXYTOCIN-SODIUM CHLORIDE 30-0.9 UT/500ML-% IV SOLN
1.0000 m[IU]/min | INTRAVENOUS | Status: DC
  Administered 2023-08-07: 2 m[IU]/min via INTRAVENOUS

## 2023-08-07 MED ORDER — TERBUTALINE SULFATE 1 MG/ML IJ SOLN: 0.2500 mg | Freq: Once | INTRAMUSCULAR | Status: DC | PRN

## 2023-08-07 MED ORDER — MISOPROSTOL 200 MCG PO TABS
ORAL_TABLET | ORAL | Status: AC
  Filled 2023-08-07: qty 4

## 2023-08-07 MED ORDER — ONDANSETRON HCL 4 MG/2ML IJ SOLN
4.0000 mg | Freq: Four times a day (QID) | INTRAMUSCULAR | Status: DC | PRN
  Administered 2023-08-07: 4 mg via INTRAVENOUS
  Filled 2023-08-07: qty 2

## 2023-08-07 MED ORDER — LACTATED RINGERS IV SOLN: INTRAVENOUS | Status: DC

## 2023-08-07 MED ORDER — OXYTOCIN 10 UNIT/ML IJ SOLN
INTRAMUSCULAR | Status: AC
  Filled 2023-08-07: qty 2

## 2023-08-07 MED ORDER — OXYTOCIN-SODIUM CHLORIDE 30-0.9 UT/500ML-% IV SOLN
INTRAVENOUS | Status: AC
  Filled 2023-08-07: qty 500

## 2023-08-07 MED ORDER — FENTANYL CITRATE (PF) 100 MCG/2ML IJ SOLN: 50.0000 ug | INTRAMUSCULAR | Status: DC | PRN

## 2023-08-07 MED ORDER — OXYTOCIN BOLUS FROM INFUSION: 333.0000 mL | Freq: Once | INTRAVENOUS | Status: DC

## 2023-08-07 NOTE — Discharge Summary (Signed)
 Postpartum Discharge Summary  Patient Name: Kelsey Serrano DOB: 1995/09/17 MRN: [id number]  Date of admission: 08/07/2023 Delivery date:08/07/2023 Delivering provider: Collin Deal Date of discharge: 08/09/2023  Primary OB: [facility] [id number] last menstrual period was 12/23/2022 (approximate). EDC Estimated Date of Delivery: 08/28/23 Gestational Age at Delivery: [redacted]w[redacted]d   Admitting diagnosis: Encounter for induction of labor [Z34.90] Intrauterine pregnancy: [redacted]w[redacted]d     Secondary diagnosis:   Active Problems:   History of postpartum hemorrhage   History of substance abuse (HCC)   Obesity in pregnancy, antepartum   Thyroid cancer (HCC)   Encounter for induction of labor   Fetal growth restriction antepartum   NSVD (normal spontaneous vaginal delivery)   Positive urine drug screen   Discharge Diagnosis: Fetal Growth Restriction; Obesity; PTSD, Anxiety, and Depression; Substance Abuse- H/o methamphetamine, Marijuana, and prescription drug use; H/o [facility]      Hospital course: Induction of Labor With Vaginal Delivery   28 y.o. yo [id number] at [redacted]w[redacted]d was admitted to the hospital 08/07/2023 for induction of labor.  Indication for induction: thyroid cancer.  Patient had an labor course complicated by none. Membrane Rupture Time/Date: 9:23 PM,08/07/2023  Delivery Method:Vaginal, Spontaneous Operative Delivery:N/A Episiotomy: None Lacerations:  None Details of delivery can be found in separate delivery note.  Patient had an uncomplicated postpartum. Patient is discharged home 08/09/23.  Newborn Data: Birth date:08/07/2023 Birth time:10:12 PM Gender:Female Living status:Living Apgars:8 ,9  Weight:2480 g                                            Post partum procedures:None Induction:: AROM, Pitocin , and Cytotec  Complications: None Delivery Type: spontaneous vaginal delivery Anesthesia: epidural anesthesia Placenta: spontaneous To Pathology: Yes   Prenatal Labs:  Blood  type/Rh O pos  Antibody screen neg  Rubella Immune  Varicella Immune  RPR NR  HBsAg Neg  HIV NR  GC neg  Chlamydia neg  Genetic screening negative  1 hour GTT 120  3 hour GTT    GBS Neg    Magnesium  Sulfate received: No BMZ received: No Rhophylac:was not indicated MMR: was not indicated Varivax vaccine given: was not indicated T-DaP:Given prenatally Flu: Given prenatally  Transfusion:No  Physical exam  Vitals:   08/08/23 1108 08/08/23 1700 08/08/23 1918 08/09/23 0742  BP: 115/61 137/82 119/81 120/67  Pulse: 73 64 71 73  Resp: 18 16 18 14   Temp: 98.4 F (36.9 C) 98 F (36.7 C) 98.9 F (37.2 C) 98 F (36.7 C)  TempSrc: Oral Oral Oral   SpO2: 100%  98% 97%  Weight:      Height:       General: alert, cooperative, and no distress Lochia: appropriate Uterine Fundus: firm Perineum: minimal edema/intact DVT Evaluation: No evidence of DVT seen on physical exam.  Labs: Lab Results  Component Value Date   WBC 13.3 (H) 08/08/2023   HGB 11.5 (L) 08/08/2023   HCT 33.7 (L) 08/08/2023   MCV 86.6 08/08/2023   PLT 157 08/08/2023      Latest Ref Rng & Units 01/31/2022    5:36 AM  CMP  Glucose 70 - 99 mg/dL 086   BUN 6 - 20 mg/dL 11   Creatinine 5.78 - 1.00 mg/dL 4.69   Sodium 629 - 528 mmol/L 138   Potassium 3.5 - 5.1 mmol/L 3.9   Chloride 98 - 111 mmol/L 107  CO2 22 - 32 mmol/L 25   Calcium  8.9 - 10.3 mg/dL 8.8   Total Protein 6.5 - 8.1 g/dL 6.7   Total Bilirubin 0.3 - 1.2 mg/dL 0.5   Alkaline Phos 38 - 126 U/L 118   AST 15 - 41 U/L 18   ALT 0 - 44 U/L 15    Edinburgh Score:    08/08/2023   12:29 PM  Edinburgh Postnatal Depression Scale Screening Tool  I have been able to laugh and see the funny side of things. 0  I have looked forward with enjoyment to things. 0  I have blamed myself unnecessarily when things went wrong. 1  I have been anxious or worried for no good reason. 1  I have felt scared or panicky for no good reason. 1  Things have been  getting on top of me. 1  I have been so unhappy that I have had difficulty sleeping. 0  I have felt sad or miserable. 1  I have been so unhappy that I have been crying. 0  The thought of harming myself has occurred to me. 0  Edinburgh Postnatal Depression Scale Total 5    Risk assessment for postpartum VTE and prophylactic treatment: Very high risk factors: None High risk factors: None Moderate risk factors: BMI 30-40 kg/m2  Postpartum VTE prophylaxis with LMWH not indicated  After visit meds:  Allergies as of 08/09/2023       Reactions   Augmentin  [amoxicillin -pot Clavulanate] Nausea And Vomiting   Cefdinir Other (See Comments)   Renal complications - unable to urinate   Estrogens Nausea And Vomiting   Pt states she is allergic to high doses of estrogens.   Lavender Oil Hives   Zyrtec [cetirizine] Nausea And Vomiting        Medication List     STOP taking these medications    metoCLOPramide  5 MG tablet Commonly known as: REGLAN    ondansetron  4 MG tablet Commonly known as: ZOFRAN        TAKE these medications    acetaminophen  500 MG tablet Commonly known as: TYLENOL  Take 2 tablets (1,000 mg total) by mouth every 6 (six) hours as needed for mild pain (pain score 1-3), fever or headache (for pain scale < 4).   albuterol  108 (90 Base) MCG/ACT inhaler Commonly known as: VENTOLIN  HFA Inhale into the lungs.   ibuprofen  600 MG tablet Commonly known as: ADVIL  Take 1 tablet (600 mg total) by mouth every 6 (six) hours as needed for mild pain (pain score 1-3) or cramping.   multivitamin-prenatal 27-0.8 MG Tabs tablet Take 1 tablet by mouth daily at 12 noon.   nicotine  14 mg/24hr patch Commonly known as: NICODERM CQ  - dosed in mg/24 hours Place 1 patch (14 mg total) onto the skin daily at 6 (six) AM. Start taking on: August 10, 2023   pantoprazole  40 MG tablet Commonly known as: PROTONIX  Take 40 mg by mouth daily.       Discharge home in stable condition Infant  Feeding: Bottle Infant Disposition:rooming in Discharge instruction: per After Visit Summary and Postpartum booklet. Activity: Advance as tolerated. Pelvic rest for 6 weeks.  Diet: routine diet Anticipated Birth Control: BTL Postpartum Appointment:6 weeks Additional Postpartum F/U: Postpartum Depression checkup Future Appointments:No future appointments. Follow up Visit:  Follow-up Information     Collin Deal, CNM. Schedule an appointment as soon as possible for a visit in 1 week(s).   Specialty: Certified Nurse Midwife Why: for a mood check Contact information:  [address] [place] [address] [phone]         Collin Deal, CNM. Schedule an appointment as soon as possible for a visit in 6 week(s).   Specialty: Certified Nurse Midwife Why: for a postpartum visit Contact information: [address] [place] [address] [phone]                 Plan:  Kelsey Serrano was discharged to home in good condition. Follow-up appointment as directed.    Signed:  Fraser Jackson, CNM Certified Nurse Midwife [facility]

## 2023-08-07 NOTE — Anesthesia Procedure Notes (Signed)
 Epidural Patient location during procedure: OB Start time: 08/07/2023 8:33 PM End time: 08/07/2023 8:50 PM  Staffing Anesthesiologist: [name], MD Performed: anesthesiologist   Preanesthetic Checklist Completed: patient identified, IV checked, site marked, risks and benefits discussed, surgical consent, monitors and equipment checked, pre-op evaluation and timeout performed  Epidural Patient position: sitting Prep: ChloraPrep Patient monitoring: heart rate, continuous pulse ox and blood pressure Approach: midline Location: L3-L4 Injection technique: LOR saline  Needle:  Needle type: Tuohy  Needle gauge: 18 G Needle length: 9 cm Needle insertion depth: 6 cm Catheter type: closed end Catheter size: 20 Guage Catheter at skin depth: 11 cm Test dose: negative and 1.5% lidocaine  with Epi 1:200 K  Assessment Events: blood not aspirated, no cerebrospinal fluid, injection not painful, no injection resistance and no paresthesia  Additional Notes Pt complained of slight pressure in left hip with injection that went away immediately and did not reoccur a minute later with continued injection Reason for block:procedure for pain

## 2023-08-07 NOTE — Progress Notes (Signed)
 Labor Progress Note  Kelsey Serrano is a 28 y.o. [id number] at [redacted]w[redacted]d by ultrasound admitted for induction of labor due to thyroid cancer.  Subjective: Pt is amenable to another dose of cytotec   Objective: BP 124/73 (BP Location: Left Arm)   Pulse 65   Temp 97.9 F (36.6 C) (Oral)   Resp 16   Ht 5\' 2"  (1.575 m)   Wt 84.8 kg   LMP 12/23/2022 (Approximate)   BMI 34.20 kg/m   Fetal Assessment: FHT:  FHR: 135 bpm, variability: moderate,  accelerations:  Present,  decelerations:  Absent Category/reactivity:  Category I UC:   Occasional SVE:    Dilation: 2.5cm  Effacement: 30%  Station:  -2  Consistency: soft  Position: middle  Membrane status: Intact Amniotic color: n/a  Labs: Lab Results  Component Value Date   WBC 12.3 (H) 08/07/2023   HGB 13.0 08/07/2023   HCT 37.3 08/07/2023   MCV 84.8 08/07/2023   PLT 203 08/07/2023    Assessment / Plan: Induction of labor  0930 1.5/30/-2 1000 Cytotec  given 1406 2.5/30/-2 1421 Cytotec  given  Labor: Progressing normally Preeclampsia:  124/73 Fetal Wellbeing:  Category I Pain Control:  Labor support without medications I/D:  Afebrile, GBS neg, Intact Anticipated MOD:  NSVD  Aron Lard, CNM 08/07/2023, 2:27 PM

## 2023-08-07 NOTE — TOC CM/SW Note (Addendum)
 CSW received a call from [employer], Cloretta Danes. SW Lurena Sally put patient and newborn on CPS Alert. Mother has five children in DSS custody. TOC should be notified after delivery and TOC will contact DSS.

## 2023-08-07 NOTE — Progress Notes (Addendum)
 Labor Progress Note  Kelsey Serrano is a 28 y.o. G3O7564 at [redacted]w[redacted]d by ultrasound admitted for induction of labor due to thyroid cancer.  Subjective: Pt is getting an epidural.  Increase in bleeding reported as well. SROM'd  Objective: BP 127/84   Pulse 81   Temp 98.1 F (36.7 C) (Oral)   Resp 16   Ht 5\' 2"  (1.575 m)   Wt 84.8 kg   LMP 12/23/2022 (Approximate)   SpO2 99%   BMI 34.20 kg/m   Fetal Assessment: FHT:  FHR: 130 bpm, variability: moderate,  accelerations:  Present,  decelerations:  Absent Category/reactivity:  Category I UC:   Occasional SVE:    Dilation: 4cm  Effacement: 60%  Station:  -2  Consistency: soft  Position: middle  Membrane status: SROM  Amniotic color: clear  Labs: Lab Results  Component Value Date   WBC 12.3 (H) 08/07/2023   HGB 13.0 08/07/2023   HCT 37.3 08/07/2023   MCV 84.8 08/07/2023   PLT 203 08/07/2023    Assessment / Plan: Induction of labor  0930 1.5/30/-2 1000 Cytotec  given 1406 2.5/30/-2 1421 Cytotec  given 1830 Pitocin  started 2021 SROM 2050 Epidural placed  Labor: Progressing normally Preeclampsia:  127/84 Fetal Wellbeing:  Category I Pain Control:  Epidural I/D:  Afebrile, GBS neg, SROM Anticipated MOD:  NSVD  Aron Lard, CNM 08/07/2023, 8:56 PM

## 2023-08-07 NOTE — H&P (Signed)
 OB History & Physical   History of Present Illness:  Chief Complaint:   HPI:  Kelsey Serrano is a 28 y.o. L8V5643 female at [redacted]w[redacted]d dated by u/s.  She presents to L&D for IOL for Thyroid cancer.  She reports:  -active fetal movement -no leakage of fluid -no vaginal bleeding -no contractions  Pregnancy Issues: Thyroid Cancer  Fetal Growth Restriction GERD Nausea/Vomiting Obesity-Pregravid BMI: 30.25 Asthma PTSD, Anxiety, and Depression Substance Abuse- H/o methamphetamine, Marijuana, and prescription drug use Social concerns- Does not have custody of her five children H/o PPH   Maternal Medical History:   Past Medical History:  Diagnosis Date   Abuse, drug or alcohol (HCC) 11/07/2010   Overview:  A.  Never IVDA.   prescription drugs, THC, methamphetamines.   B.   Denies any since March 2013.  No methamphetamines 04/24/2009 Addendum [facility]- NCCSR check is negative, reviewed with PCP do not think current problem    Airway hyperreactivity 11/07/2010   Overview:  A.  No prior ED visits or hospitalization    Asthma    Calculus of kidney 10/25/2014   Chronic cough 01/01/2012   Overview:  A.  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  Depression, PTSA, mood disorder NOS B.  Psychiatrist:  Jamison Mccreedy C.  Therapist:  Thurmon [place]    Renal colic 10/25/2014   Renal disorder    Restless leg syndrome    Thyroid Cancer (HCC) 05/2023    Past Surgical History:  Procedure Laterality Date   COLONOSCOPY WITH PROPOFOL  N/A 12/04/2014   Procedure: COLONOSCOPY WITH PROPOFOL ;  Surgeon: Marnee Sink, MD;  Location: [facility];  Service: Endoscopy;  Laterality: N/A;   CYSTOSCOPY W/ URETERAL  STENT PLACEMENT Left 01/31/2022   Procedure: CYSTOSCOPY WITH RETROGRADE PYELOGRAM/URETERAL STENT PLACEMENT;  Surgeon: Dustin Gimenez, MD;  Location: [facility] ORS;  Service: Urology;  Laterality: Left;   CYSTOSCOPY/URETEROSCOPY/HOLMIUM LASER/STENT PLACEMENT Left 05/14/2022   Procedure: CYSTOSCOPY WITH RETROGRADE PYELOGRAM/EXPLANT OF RETAINED STENT;  Surgeon: Dustin Gimenez, MD;  Location: [facility] ORS;  Service: Urology;  Laterality: Left;   DENTAL SURGERY     ESOPHAGOGASTRODUODENOSCOPY (EGD) WITH PROPOFOL  N/A 12/04/2014   Procedure: ESOPHAGOGASTRODUODENOSCOPY (EGD) WITH PROPOFOL ;  Surgeon: Marnee Sink, MD;  Location: [facility];  Service: Endoscopy;  Laterality: N/A;   kidney stent     kidney stones Left    LITHOTRIPSY      Allergies  Allergen Reactions   Augmentin  [Amoxicillin -Pot Clavulanate] Nausea And Vomiting   Cefdinir Other (See Comments)    Renal complications - unable to urinate   Estrogens Nausea And Vomiting    Pt states she is allergic to high doses of estrogens.   Lavender Oil Hives   Zyrtec [Cetirizine] Nausea And Vomiting    Prior to Admission medications   Medication Sig Start Date End Date Taking? Authorizing Provider  albuterol  (VENTOLIN  HFA) 108 (90 Base) MCG/ACT inhaler Inhale into the lungs. 11/04/20   [provider]  metoCLOPramide  (REGLAN ) 5 MG tablet Take 5 mg by mouth 3 (three) times daily as needed for nausea.    [provider]  ondansetron  (ZOFRAN ) 4 MG tablet Take 4 mg by mouth  every 8 (eight) hours as needed for nausea or vomiting.    [provider]  pantoprazole  (PROTONIX ) 40 MG tablet Take 40 mg by mouth daily.    [provider]  Prenatal Vit-Fe Fumarate-FA (MULTIVITAMIN-PRENATAL) 27-0.8 MG TABS tablet Take 1 tablet by mouth daily at 12 noon.    [provider]     Prenatal care site: [facility]    Social History: She  reports that she has quit smoking. Her smoking use included cigarettes. She  has a 7 pack-year smoking history. She has been exposed to tobacco smoke. She has never used smokeless tobacco. She reports current drug use. Drugs: Marijuana and Amphetamines. She reports that she does not drink alcohol.  Family History: family history includes Diabetes in her mother.   Review of Systems: A full review of systems was performed and negative except as noted in the HPI.    Physical Exam:  Vital Signs: Ht 5\' 2"  (1.575 m)   Wt 84.8 kg   LMP 12/23/2022 (Approximate)   BMI 34.20 kg/m   General:   alert and cooperative  Skin:  normal  Neurologic:    Alert & oriented x 3  Lungs:   Nl effort  Heart:   regular rate and rhythm  Abdomen:  soft, non-tender; bowel sounds normal; no masses,  no organomegaly  Extremities: : non-tender, symmetric, No edema bilaterally.     EFW: 07/31/23       2,313   g       9%        5 lb 2  oz     Pertinent Results:  Prenatal Labs: Blood type/Rh O pos  Antibody screen neg  Rubella Immune  Varicella Immune  RPR NR  HBsAg Neg  HIV NR  GC neg  Chlamydia neg  Genetic screening negative  1 hour GTT 120  3 hour GTT   GBS Neg   FHT: FHR: 135 bpm, variability: moderate,  accelerations:  Present,  decelerations:  Absent Category/reactivity:  Category I TOCO: none SVE: Dilation: 1.5 / Effacement (%): 30 / Station: -2     Assessment:  Kelsey Serrano is a 28 y.o. G52P5015 female at [redacted]w[redacted]d here for IOL for thyroid cancer.   Plan:  1. Admit to Labor & Delivery; consents reviewed and obtained  2. Fetal Well being  - Fetal Tracing: Cat I - GBS neg - Presentation: vtx confirmed by sve   3. Routine OB: - Prenatal labs reviewed, as above - Rh pos - CBC & T&S on admit - Clear fluids, IVF  4. Induction of Labor -  Contractions by external toco in place -  Pelvis proven to 2820g -  Plan for induction with Cytotec  -  Plan for continuous fetal monitoring  -  Maternal pain control as desired: IVPM, nitrous, regional anesthesia -  Anticipate vaginal delivery  5. Post Partum Planning: - Infant feeding: TBD - Contraception: TBD - Tdap: given 06/11/23  - Flu: received 03/13/23  - RSV: out of season  Collin Deal, CNM 08/07/2023 9:33 AM

## 2023-08-07 NOTE — Anesthesia Preprocedure Evaluation (Signed)
 Anesthesia Evaluation  Patient identified by MRN, date of birth, ID band Patient awake    Reviewed: Allergy & Precautions, H&P , NPO status , Patient's Chart, lab work & pertinent test results  Airway Mallampati: II  TM Distance: >3 FB Neck ROM: full    Dental no notable dental hx.    Pulmonary asthma , former smoker   Pulmonary exam normal        Cardiovascular Exercise Tolerance: Good negative cardio ROS Normal cardiovascular exam     Neuro/Psych   Anxiety Depression       GI/Hepatic negative GI ROS,,,(+)     substance abuse  marijuana use  Endo/Other    Renal/GU   negative genitourinary   Musculoskeletal   Abdominal   Peds  Hematology negative hematology ROS (+)   Anesthesia Other Findings Past Medical History: 11/07/2010: Abuse, drug or alcohol (HCC)     Comment:  Overview:  A.  Never IVDA.   prescription drugs, THC,               methamphetamines.   B.   Denies any since March 2013.  No              methamphetamines 04/24/2009 Addendum [facility]- NCCSR check is               negative, reviewed with PCP do not think current problem  11/07/2010: Airway hyperreactivity     Comment:  Overview:  A.  No prior ED visits or hospitalization  No date: Asthma 10/25/2014: Calculus of kidney 01/01/2012: Chronic cough     Comment:  Overview:  A.  "smokers cough"  No date: Complication of anesthesia     Comment:  BP and HR were low after lithotripsy([facility]) 04/12/2013: Current smoker 10/25/2014: D (diarrhea) 10/02/2011: Depression, major, recurrent (HCC) No date: Hypothyroidism     Comment:  when younger - resolved No date: Impacted tooth     Comment:  current - wisdon tooth 10/02/2011: Neurosis, posttraumatic     Comment:  Overview:  A.  Depression, PTSA, mood disorder NOS B.                Psychiatrist:  Jamison Mccreedy C.  Therapist:  Thurmon [place]  10/25/2014: Renal colic No date: Renal disorder No date: Restless leg  syndrome 05/2023: Thyroid [facility])  Past Surgical History: 12/04/2014: COLONOSCOPY WITH PROPOFOL ; N/A     Comment:  Procedure: COLONOSCOPY WITH PROPOFOL ;  Surgeon: Marnee Sink, MD;  Location: [facility];  Service:               Endoscopy;  Laterality: N/A; 01/31/2022: CYSTOSCOPY W/ URETERAL STENT PLACEMENT; Left     Comment:  Procedure: CYSTOSCOPY WITH RETROGRADE PYELOGRAM/URETERAL              STENT PLACEMENT;  Surgeon: Dustin Gimenez, MD;                Location: [facility] ORS;  Service: Urology;  Laterality: Left; 05/14/2022: CYSTOSCOPY/URETEROSCOPY/HOLMIUM LASER/STENT PLACEMENT; Left     Comment:  Procedure: CYSTOSCOPY WITH RETROGRADE PYELOGRAM/EXPLANT               OF RETAINED STENT;  Surgeon: Dustin Gimenez, MD;                Location: [facility] ORS;  Service: Urology;  Laterality: Left; No date: DENTAL SURGERY 12/04/2014: ESOPHAGOGASTRODUODENOSCOPY (EGD) WITH PROPOFOL ; N/A  Comment:  Procedure: ESOPHAGOGASTRODUODENOSCOPY (EGD) WITH               PROPOFOL ;  Surgeon: Marnee Sink, MD;  Location: [facility]               S[facility];  Service: Endoscopy;  Laterality: N/A; No date: kidney stent No date: kidney stones; Left No date: LITHOTRIPSY  BMI    Body Mass Index: 34.20 kg/m      Reproductive/Obstetrics (+) Pregnancy                             Anesthesia Physical Anesthesia Plan  ASA: 2  Anesthesia Plan: Epidural   Post-op Pain Management:    Induction:   PONV Risk Score and Plan:   Airway Management Planned:   Additional Equipment:   Intra-op Plan:   Post-operative Plan:   Informed Consent: I have reviewed the patients History and Physical, chart, labs and discussed the procedure including the risks, benefits and alternatives for the proposed anesthesia with the patient or authorized representative who has indicated his/her understanding and acceptance.       Plan Discussed with: Anesthesiologist and  CRNA  Anesthesia Plan Comments:        Anesthesia Quick Evaluation

## 2023-08-08 ENCOUNTER — Encounter: Payer: Self-pay | Admitting: Obstetrics and Gynecology

## 2023-08-08 LAB — CBC
HCT: 33.7 % — ABNORMAL LOW (ref 36.0–46.0)
Hemoglobin: 11.5 g/dL — ABNORMAL LOW (ref 12.0–15.0)
MCH: 29.6 pg (ref 26.0–34.0)
MCHC: 34.1 g/dL (ref 30.0–36.0)
MCV: 86.6 fL (ref 80.0–100.0)
Platelets: 157 10*3/uL (ref 150–400)
RBC: 3.89 MIL/uL (ref 3.87–5.11)
RDW: 13.2 % (ref 11.5–15.5)
WBC: 13.3 10*3/uL — ABNORMAL HIGH (ref 4.0–10.5)
nRBC: 0 % (ref 0.0–0.2)

## 2023-08-08 LAB — RPR: RPR Ser Ql: NONREACTIVE

## 2023-08-08 MED ORDER — TETANUS-DIPHTH-ACELL PERTUSSIS 5-2.5-18.5 LF-MCG/0.5 IM SUSY
0.5000 mL | PREFILLED_SYRINGE | Freq: Once | INTRAMUSCULAR | Status: DC
  Filled 2023-08-08: qty 0.5

## 2023-08-08 MED ORDER — SENNOSIDES-DOCUSATE SODIUM 8.6-50 MG PO TABS
2.0000 | ORAL_TABLET | Freq: Every day | ORAL | Status: DC
  Administered 2023-08-08 – 2023-08-09 (×2): 2 via ORAL
  Filled 2023-08-08 (×2): qty 2

## 2023-08-08 MED ORDER — DIPHENHYDRAMINE HCL 25 MG PO CAPS: 25.0000 mg | ORAL_CAPSULE | Freq: Four times a day (QID) | ORAL | Status: DC | PRN

## 2023-08-08 MED ORDER — WITCH HAZEL-GLYCERIN EX PADS: 1.0000 | MEDICATED_PAD | CUTANEOUS | Status: DC | PRN

## 2023-08-08 MED ORDER — ONDANSETRON HCL 4 MG/2ML IJ SOLN: 4.0000 mg | INTRAMUSCULAR | Status: DC | PRN

## 2023-08-08 MED ORDER — DIBUCAINE (PERIANAL) 1 % EX OINT: 1.0000 | TOPICAL_OINTMENT | CUTANEOUS | Status: DC | PRN

## 2023-08-08 MED ORDER — SIMETHICONE 80 MG PO CHEW: 80.0000 mg | CHEWABLE_TABLET | ORAL | Status: DC | PRN

## 2023-08-08 MED ORDER — ACETAMINOPHEN 325 MG PO TABS
650.0000 mg | ORAL_TABLET | ORAL | Status: DC | PRN
  Administered 2023-08-08 (×2): 650 mg via ORAL
  Filled 2023-08-08: qty 2

## 2023-08-08 MED ORDER — BENZOCAINE-MENTHOL 20-0.5 % EX AERO
1.0000 | INHALATION_SPRAY | CUTANEOUS | Status: DC | PRN
  Filled 2023-08-08: qty 56

## 2023-08-08 MED ORDER — COCONUT OIL OIL: 1.0000 | TOPICAL_OIL | Status: DC | PRN

## 2023-08-08 MED ORDER — IBUPROFEN 600 MG PO TABS
600.0000 mg | ORAL_TABLET | Freq: Four times a day (QID) | ORAL | Status: DC
  Administered 2023-08-08 – 2023-08-09 (×7): 600 mg via ORAL
  Filled 2023-08-08 (×7): qty 1

## 2023-08-08 MED ORDER — OXYCODONE HCL 5 MG PO TABS: 5.0000 mg | ORAL_TABLET | Freq: Four times a day (QID) | ORAL | Status: DC | PRN

## 2023-08-08 MED ORDER — ONDANSETRON HCL 4 MG PO TABS: 4.0000 mg | ORAL_TABLET | ORAL | Status: DC | PRN

## 2023-08-08 MED ORDER — FERROUS SULFATE 325 (65 FE) MG PO TABS
325.0000 mg | ORAL_TABLET | Freq: Two times a day (BID) | ORAL | Status: DC
  Administered 2023-08-08 – 2023-08-09 (×4): 325 mg via ORAL
  Filled 2023-08-08 (×4): qty 1

## 2023-08-08 MED ORDER — NICOTINE 14 MG/24HR TD PT24
14.0000 mg | MEDICATED_PATCH | Freq: Every day | TRANSDERMAL | Status: DC
  Administered 2023-08-08 – 2023-08-09 (×2): 14 mg via TRANSDERMAL
  Filled 2023-08-08 (×2): qty 1

## 2023-08-08 MED ORDER — PRENATAL MULTIVITAMIN CH
1.0000 | ORAL_TABLET | Freq: Every day | ORAL | Status: DC
  Administered 2023-08-08 – 2023-08-09 (×2): 1 via ORAL
  Filled 2023-08-08 (×4): qty 1

## 2023-08-08 NOTE — TOC Initial Note (Addendum)
 Transition of Care [facility] [place]) - Initial/Assessment Note    Patient Details  Name: Kelsey Serrano MRN: [id number] Date of Birth: [birth year]  Transition of Care [facility]) CM/SW Contact:    [place] L Pearline Yerby, LCSW Phone Number: 08/08/2023, 10:11 AM  Clinical Narrative:                  CSW completed assessment with parents. FOB was at bedside. His name is Acupuncturist. No last name provided. CSW advised patient of HIPAA rights and explained role. [name] agreed that FOB can be present.   [name] reports being tired. She stated that yesterday she fell and had to ask RN for assistance. She stated that her body after the anesthesia is still numb. [name] reported that other than that, she is feeling ok. She reported being anxious because of the unknown. She shared that there is current DSS involvement and her five daughters are in DSS custody. FOB advised that he has daughters from previous relationship/s as well.   [name] confirmed her address to be [address] in [place], [place]. She reported living with FOB and his parents.   Pt self reported a history of PTSD, Major Depression and currently thyroid cancer. Pt states that she is not on any psychotropic medications to treat her diagnosis. She reported receiving MH treatment at [facility].   Pt. Was provided with resources for SIDS, safe sleeping and postpartum depression.   Pt acknowledged concerns for THC use. She advised that she used Delta 8 more than a month ago so she is not sure why there was a positive drug screen.   Pt advised that she has the minimum basic necessities re: bassinet, diapers, wipes, clothes, car seat etc .   Pt and significant other have transportation.   Pt is currently on probation and receives random drug screens. She reported that she recently passed a screen which adds to her confusion that her UDS at [facility] tested positive for THC.   Pt is concerned about her current [facility] case with [employer]. She stated that she wants to take her  baby home but she has five children in care. CSW was transparent and advised that her SW already contacted CSW to put an alert out and a CPS report will be made because of the positive UDS. Pt was receptive to the information provided.     Newborn was observed to be sleeping in bassinet. FOB picked baby up and he was observed to be gentle and loving.   CPS report to be made. CSW will await disposition from DSS to determine what discharge planning will look like.   Patient Goals and CMS Choice            Expected Discharge Plan and Services                                              Prior Living Arrangements/Services                       Activities of Daily Living   ADL Screening (condition at time of admission) Independently performs ADLs?: Yes (appropriate for developmental age) Is the patient deaf or have difficulty hearing?: No Does the patient have difficulty seeing, even when wearing glasses/contacts?: No Does the patient have difficulty concentrating, remembering, or making decisions?: No  Permission Sought/Granted  Emotional Assessment              Admission diagnosis:  Encounter for induction of labor [Z34.90] Patient Active Problem List   Diagnosis Date Noted   Encounter for induction of labor 08/07/2023   History of substance abuse (HCC) 06/11/2023   Obesity in pregnancy, antepartum 06/11/2023   Thyroid cancer (HCC) 05/14/2023   High-risk pregnancy in third trimester 03/06/2023   History of postpartum hemorrhage 03/31/2022   PCP:  Maryhelen Snide, MD (Inactive) Pharmacy:   [facility] [address] [place] - [address] [place] [address] Phone: [phone] Fax: [phone]     Social Drivers of Health (SDOH) Social History: SDOH Screenings   Food Insecurity: No Food Insecurity (08/07/2023)  Housing: Low Risk  (08/07/2023)  Transportation Needs: No Transportation Needs  (08/07/2023)  Utilities: Not At Risk (08/07/2023)  Financial Resource Strain: Low Risk  (04/08/2022)   [facility] from [facility]  Tobacco Use: Medium Risk (08/07/2023)   SDOH Interventions:     Readmission Risk Interventions     No data to display

## 2023-08-08 NOTE — Progress Notes (Signed)
   08/08/23 0200  What Happened  Was fall witnessed? Yes  Who witnessed fall? Toni Frank RN  Patients activity before fall other (comment) (in bed post epidural)  Point of contact hip/leg  Was patient injured? No  Patient found on floor  Found by Staff-comment  Stated prior activity other (comment)  Provider Notification  Provider Name/Title [place] CNM  Date Provider Notified 08/08/23  Time Provider Notified 0200  Method of Notification Call  Notification Reason Other (Comment) (patient fall)  Provider response Evaluate remotely;No new orders  Date of Provider Response 08/08/23  Time of Provider Response 0200

## 2023-08-08 NOTE — Lactation Note (Signed)
 This note was copied from a baby's chart. Lactation Consultation Note  Patient Name: Kelsey Serrano Today's Date: 08/08/2023 Age:28 hours     Maternal Data  RN reported to [facility] that this mother has only been formula feeding by bottle. LC asked RN to notify mom that lactation assistance is available today and to call for any questions or help needed.  Feeding Mother's Current Feeding Choice: Formula Nipple Type: Slow - flow  LATCH Score                    Lactation Tools Discussed/Used    Interventions    Discharge    Consult Status Consult Status: PRN    Seldon Dago 08/08/2023, 9:10 AM

## 2023-08-08 NOTE — TOC Progression Note (Signed)
 Transition of Care [facility]) - Progression Note    Patient Details  Name: Kelsey Serrano MRN: [id number] Date of Birth: October 14, 1995  Transition [facility] Care [facility]) CM/SW Contact  [place] L Olanna [place], [place] Phone Number: 08/08/2023, 2:17 PM  Clinical Narrative:     CSW spoke with Kayleen Party, [employer], regarding CPS report. CPS report was made. Kayleen Party will contact CSW to advise of DSS plan for baby's discharge.        Expected Discharge Plan and Services                                               Social Determinants of Health (SDOH) Interventions SDOH Screenings   Food Insecurity: No Food Insecurity (08/07/2023)  Housing: Low Risk  (08/07/2023)  Transportation Needs: No Transportation Needs (08/07/2023)  Utilities: Not At Risk (08/07/2023)  Financial Resource Strain: Low Risk  (04/08/2022)   Received from [facility]  Tobacco Use: Medium Risk (08/07/2023)    Readmission Risk Interventions     No data to display

## 2023-08-08 NOTE — Progress Notes (Signed)
 Patient called out for nurse to go to the restroom. RN responded in person to call. Patient told RN that she needed to use the restroom. RN told patient to wait in bed while RN went to get the steady to help patient to the bathroom. Upon returning to patient's room with steady, RN entered room to witness patient attempting to stand and proceeding to fall onto left leg. Patient landed in sitting position on left leg. RN assisted patient back into bed and assessed patient for injuries. Patient's assessment was WNL. Patient denied any pain or injuries. RN continued to assist patient onto the steady to go to the bathroom. RN returned patient to the bed and educated patient on fall risk. Patient verbalized understanding and had no questions. Patient was given the call light and educated on the importance of using the call light for assistance before getting out of the bed. RN notified provider of event. Provider verbalized notification and there were no new orders placed. Patient marked as a high fall risk and protocol in place.

## 2023-08-08 NOTE — Progress Notes (Signed)
 Postpartum Day  1  Subjective: 28 y.o. [id number] postpartum day #1 status post normal spontaneous vaginal delivery. She is ambulating, is tolerating po, is voiding spontaneously.  Her pain is well controlled on PO pain medications. Her lochia is less than menses.  Objective: BP 115/61 (BP Location: Right Arm)   Pulse 73   Temp 98.4 F (36.9 C) (Oral)   Resp 18   Ht 5\' 2"  (1.575 m)   Wt 84.8 kg   LMP 12/23/2022 (Approximate)   SpO2 100%   Breastfeeding Unknown   BMI 34.20 kg/m    Physical Exam:  General: alert, cooperative, and no distress Breasts: soft/nontender Pulm: nl effort Abdomen: soft, non-tender, active bowel sounds Uterine Fundus: firm Perineum: minimal edema, intact Lochia: appropriate DVT Evaluation: No evidence of DVT seen on physical exam.  Recent Labs    08/07/23 0945 08/08/23 0952  HGB 13.0 11.5*  HCT 37.3 33.7*  WBC 12.3* 13.3*  PLT 203 157    Assessment/Plan: 28 y.o. [id number] postpartum day # 1  1. Continue routine postpartum care  2. Infant feeding status: formula feeding -Encouraged snug fitting bra, cold application, Tylenol  PRN, and cabbage leaves for engorgement for formula feeding   3. Contraception plan: bilateral tubal ligation  -Did not sign consent -Will plan interval BTL  4. Acute blood loss anemia - clinically not significant .  -Hemodynamically stable and asymptomatic -Intervention: start on oral supplementation with ferrous sulfate 325 mg   5. Immunization status:   all immunizations up to date  6. Social considerations  -TOC consult completed  -UDS on admission positive for [facility]  -Her other children are in the care of  either her grandmother or [place] care. She is appropriately concerned about if she will be allowed to take her baby home with her. Per TOC, her SW already contacted CSW to evaluate the situation.  -Currently followed by [employer] program  -history of bipolar d/o -> Resources provided for postpartum  depression  7. Thyroid cancer  -Dx with thyroid cancer in pregnancy  -Has appt scheduled for MRI mapping postpartum with plans for surgery in August  -Followed by [facility]   Disposition: continue inpatient postpartum care    LOS: 1 day   Teodora Fell, CNM 08/08/2023, 11:31 AM   ----- Fraser Jackson  Certified Nurse Midwife [facility]

## 2023-08-08 NOTE — TOC Progression Note (Signed)
 Transition of Care [facility]) - Progression Note    Patient Details  Name: Kelsey Serrano MRN: [id number] Date of Birth: [birth year]  Transition of Care [facility]) CM/SW Contact  [place] L Maliah [place], [place] Phone Number: 08/08/2023, 4:55 PM  Clinical Narrative:     CSW received a call from [employer] Social Worker, Kayleen Party. Kayleen Party advised that she staffed the case with a Location manager. A plan has not been made with the family as of yet. Kayleen Party asked if the baby could remain at the hospital until Monday morning given [employer] time to develop a plan. Kayleen Party advised that a juvenile petition has been drafted for the baby but they want to consider all possible outcomes. CSW agreed that baby will not be discharged with the parents and Monday morning there will be a follow-up by [employer].        Expected Discharge Plan and Services                                               Social Determinants of Health (SDOH) Interventions SDOH Screenings   Food Insecurity: No Food Insecurity (08/07/2023)  Housing: Low Risk  (08/07/2023)  Transportation Needs: No Transportation Needs (08/07/2023)  Utilities: Not At Risk (08/07/2023)  Financial Resource Strain: Low Risk  (04/08/2022)   [facility]  Tobacco Use: Medium Risk (08/07/2023)    Readmission Risk Interventions     No data to display

## 2023-08-08 NOTE — Anesthesia Postprocedure Evaluation (Signed)
 Anesthesia Post Note  Patient: Kelsey Serrano  Procedure(s) Performed: AN AD HOC LABOR EPIDURAL  Patient location during evaluation: Mother Baby Anesthesia Type: Epidural Level of consciousness: awake and alert Pain management: pain level controlled Vital Signs Assessment: post-procedure vital signs reviewed and stable Respiratory status: spontaneous breathing, nonlabored ventilation and respiratory function stable Cardiovascular status: stable Postop Assessment: no headache, no backache and epidural receding Anesthetic complications: no   No notable events documented.   Last Vitals:  Vitals:   08/08/23 0725 08/08/23 1108  BP: (!) 97/56 115/61  Pulse: (!) 58 73  Resp: 18 18  Temp: 36.4 C 36.9 C  SpO2: 97% 100%    Last Pain:  Vitals:   08/08/23 1108  TempSrc: Oral  PainSc:                  [name]

## 2023-08-09 DIAGNOSIS — R825 Elevated urine levels of drugs, medicaments and biological substances: Secondary | ICD-10-CM | POA: Diagnosis present

## 2023-08-09 DIAGNOSIS — O365990 Maternal care for other known or suspected poor fetal growth, unspecified trimester, not applicable or unspecified: Secondary | ICD-10-CM | POA: Diagnosis present

## 2023-08-09 MED ORDER — IBUPROFEN 600 MG PO TABS: 600.0000 mg | ORAL_TABLET | Freq: Four times a day (QID) | ORAL | 0 refills | Status: AC | PRN

## 2023-08-09 MED ORDER — NICOTINE 14 MG/24HR TD PT24
14.0000 mg | MEDICATED_PATCH | Freq: Every day | TRANSDERMAL | 0 refills | Status: AC
Start: 2023-08-10 — End: ?

## 2023-08-09 MED ORDER — ACETAMINOPHEN 500 MG PO TABS: 1000.0000 mg | ORAL_TABLET | Freq: Four times a day (QID) | ORAL | Status: DC | PRN

## 2023-08-09 MED ORDER — ACETAMINOPHEN 500 MG PO TABS: 1000.0000 mg | ORAL_TABLET | Freq: Four times a day (QID) | ORAL | Status: AC | PRN

## 2023-08-09 NOTE — Plan of Care (Signed)
  Problem: Education: Goal: Knowledge of Childbirth will improve Outcome: Adequate for Discharge Goal: Ability to make informed decisions regarding treatment and plan of care will improve Outcome: Adequate for Discharge Goal: Ability to state and carry out methods to decrease the pain will improve Outcome: Adequate for Discharge Goal: Individualized Educational Video(s) Outcome: Adequate for Discharge   Problem: Coping: Goal: Ability to verbalize concerns and feelings about labor and delivery will improve Outcome: Adequate for Discharge   Problem: Life Cycle: Goal: Ability to make normal progression through stages of labor will improve Outcome: Adequate for Discharge Goal: Ability to effectively push during vaginal delivery will improve Outcome: Adequate for Discharge   Problem: Role Relationship: Goal: Will demonstrate positive interactions with the child Outcome: Adequate for Discharge   Problem: Safety: Goal: Risk of complications during labor and delivery will decrease Outcome: Adequate for Discharge   Problem: Pain Management: Goal: Relief or control of pain from uterine contractions will improve Outcome: Adequate for Discharge   Problem: Education: Goal: Knowledge of General Education information will improve Description: Including pain rating scale, medication(s)/side effects and non-pharmacologic comfort measures Outcome: Adequate for Discharge   Problem: Health Behavior/Discharge Planning: Goal: Ability to manage health-related needs will improve Outcome: Adequate for Discharge   Problem: Clinical Measurements: Goal: Ability to maintain clinical measurements within normal limits will improve Outcome: Adequate for Discharge Goal: Will remain free from infection Outcome: Adequate for Discharge Goal: Diagnostic test results will improve Outcome: Adequate for Discharge Goal: Respiratory complications will improve Outcome: Adequate for Discharge Goal:  Cardiovascular complication will be avoided Outcome: Adequate for Discharge   Problem: Activity: Goal: Risk for activity intolerance will decrease Outcome: Adequate for Discharge   Problem: Nutrition: Goal: Adequate nutrition will be maintained Outcome: Adequate for Discharge   Problem: Coping: Goal: Level of anxiety will decrease Outcome: Adequate for Discharge   Problem: Elimination: Goal: Will not experience complications related to bowel motility Outcome: Adequate for Discharge Goal: Will not experience complications related to urinary retention Outcome: Adequate for Discharge   Problem: Pain Managment: Goal: General experience of comfort will improve and/or be controlled Outcome: Adequate for Discharge   Problem: Safety: Goal: Ability to remain free from injury will improve Outcome: Adequate for Discharge   Problem: Skin Integrity: Goal: Risk for impaired skin integrity will decrease Outcome: Adequate for Discharge   Problem: Education: Goal: Knowledge of condition will improve Outcome: Adequate for Discharge Goal: Individualized Educational Video(s) Outcome: Adequate for Discharge Goal: Individualized Newborn Educational Video(s) Outcome: Adequate for Discharge   Problem: Activity: Goal: Will verbalize the importance of balancing activity with adequate rest periods Outcome: Adequate for Discharge Goal: Ability to tolerate increased activity will improve Outcome: Adequate for Discharge   Problem: Coping: Goal: Ability to identify and utilize available resources and services will improve Outcome: Adequate for Discharge   Problem: Life Cycle: Goal: Chance of risk for complications during the postpartum period will decrease Outcome: Adequate for Discharge   Problem: Role Relationship: Goal: Ability to demonstrate positive interaction with newborn will improve Outcome: Adequate for Discharge   Problem: Skin Integrity: Goal: Demonstration of wound healing  without infection will improve Outcome: Adequate for Discharge

## 2023-08-09 NOTE — Discharge Instructions (Signed)
 Vaginal Delivery, Care After Refer to this sheet in the next few weeks. These discharge instructions provide you with information on caring for yourself after delivery. Your caregiver may also give you specific instructions. Your treatment has been planned according to the most current medical practices available, but problems sometimes occur. Call your caregiver if you have any problems or questions after you go home. HOME CARE INSTRUCTIONS Take over-the-counter or prescription medicines only as directed by your caregiver or pharmacist. Do not drink alcohol, especially if you are breastfeeding or taking medicine to relieve pain. Do not smoke tobacco. Continue to use good perineal care. Good perineal care includes: Wiping your perineum from back to front Keeping your perineum clean. You can do sitz baths twice a day, to help keep this area clean Do not use tampons, douche or have sex until your caregiver says it is okay. Shower only and avoid sitting in submerged water, aside from sitz baths Wear a well-fitting bra that provides breast support. Eat healthy foods. Drink enough fluids to keep your urine clear or pale yellow. Eat high-fiber foods such as whole grain cereals and breads, brown rice, beans, and fresh fruits and vegetables every day. These foods may help prevent or relieve constipation. Avoid constipation with high fiber foods or medications, such as miralax or metamucil Follow your caregiver's recommendations regarding resumption of activities such as climbing stairs, driving, lifting, exercising, or traveling. Talk to your caregiver about resuming sexual activities. Resumption of sexual activities is dependent upon your risk of infection, your rate of healing, and your comfort and desire to resume sexual activity. Try to have someone help you with your household activities and your newborn for at least a few days after you leave the hospital. Rest as much as possible. Try to rest or  take a nap when your newborn is sleeping. Increase your activities gradually. Keep all of your scheduled postpartum appointments. It is very important to keep your scheduled follow-up appointments. At these appointments, your caregiver will be checking to make sure that you are healing physically and emotionally. SEEK MEDICAL CARE IF:  You are passing large clots from your vagina. Save any clots to show your caregiver. You have a foul smelling discharge from your vagina. You have trouble urinating. You are urinating frequently. You have pain when you urinate. You have a change in your bowel movements. You have increasing redness, pain, or swelling near your vaginal incision (episiotomy) or vaginal tear. You have pus draining from your episiotomy or vaginal tear. Your episiotomy or vaginal tear is separating. You have painful, hard, or reddened breasts. You have a severe headache. You have blurred vision or see spots. You feel sad or depressed. You have thoughts of hurting yourself or your newborn. You have questions about your care, the care of your newborn, or medicines. You are dizzy or light-headed. You have a rash. You have nausea or vomiting. You were breastfeeding and have not had a menstrual period within 12 weeks after you stopped breastfeeding. You are not breastfeeding and have not had a menstrual period by the 12th week after delivery. You have a fever. SEEK IMMEDIATE MEDICAL CARE IF:  You have persistent pain. You have chest pain. You have shortness of breath. You faint. You have leg pain. You have stomach pain. Your vaginal bleeding saturates two or more sanitary pads in 1 hour. MAKE SURE YOU:  Understand these instructions. Will watch your condition. Will get help right away if you are not doing well or  get worse. Document Released: 02/15/2000 Document Revised: 07/04/2013 Document Reviewed: 10/15/2011 [facility] Patient Information 2015 [place], [place]. This  information is not intended to replace advice given to you by your health care provider. Make sure you discuss any questions you have with your health care provider.  Sitz Bath A sitz bath is a warm water bath taken in the sitting position. The water covers only the hips and butt (buttocks). We recommend using one that fits in the toilet, to help with ease of use and cleanliness. It may be used for either healing or cleaning purposes. Sitz baths are also used to relieve pain, itching, or muscle tightening (spasms). The water may contain medicine. Moist heat will help you heal and relax.  HOME CARE  Take 3 to 4 sitz baths a day. Fill the bathtub half-full with warm water. Sit in the water and open the drain a little. Turn on the warm water to keep the tub half-full. Keep the water running constantly. Soak in the water for 15 to 20 minutes. After the sitz bath, pat the affected area dry. GET HELP RIGHT AWAY IF: You get worse instead of better. Stop the sitz baths if you get worse. MAKE SURE YOU: Understand these instructions. Will watch your condition. Will get help right away if you are not doing well or get worse. Document Released: 03/27/2004 Document Revised: 11/12/2011 Document Reviewed: 06/17/2010 [facility] Patient Information 2015 [place], [place]. This information is not intended to replace advice given to you by your health care provider. Make sure you discuss any questions you have with your health care provider.

## 2023-08-09 NOTE — Progress Notes (Addendum)
 Pt. Given AVS. Pt educated on medications and where to receive medications. Pt educated on postpartum hemorrhage, signs of infection, postpartum pre-eclampsia, blood clots/dvt. When to call provider and follow-up appointments. Pt verbalized understanding of AVS/Discharge.

## 2023-08-09 NOTE — Progress Notes (Signed)
 Postpartum Day  2  Subjective: 28 y.o. [id number] postpartum day #1 status post normal spontaneous vaginal delivery. She is ambulating, is tolerating po, is voiding spontaneously.  Her pain is well controlled on PO pain medications. Her lochia is less than menses.  Objective: BP 120/67   Pulse 73   Temp 98 F (36.7 C)   Resp 14   Ht 5\' 2"  (1.575 m)   Wt 84.8 kg   LMP 12/23/2022 (Approximate)   SpO2 97%   Breastfeeding Unknown   BMI 34.20 kg/m    Physical Exam:  General: alert, cooperative, and no distress Breasts: soft/nontender Pulm: nl effort Abdomen: non-tender Uterine Fundus: firm Perineum: minimal edema, intact Lochia: appropriate  Recent Labs    08/07/23 0945 08/08/23 0952  HGB 13.0 11.5*  HCT 37.3 33.7*  WBC 12.3* 13.3*  PLT 203 157    Assessment/Plan: 28 y.o. [id number] postpartum day # 1  1. Continue routine postpartum care  2. Infant feeding status: formula feeding -Encouraged snug fitting bra, cold application, Tylenol  PRN, and cabbage leaves for engorgement for formula feeding   3. Contraception plan: bilateral tubal ligation  -Will plan for interval BTL  4. Acute blood loss anemia - clinically not significant .  -Hemodynamically stable and asymptomatic -Intervention: continue on oral supplementation with ferrous sulfate 325  5. Immunization status:   all immunizations up to date  6. Social considerations  - UDS on admission positive for THC. Jerilynn denies any recent use. Also reports she had a negative UDS with her probation team. Of note, infant urine drug screen was negative. Cord sample is pending.  -ACDSS Social Worker, Kayleen Party, is assessing discharge disposition of infant. Currently considering possible outcomes but no decision has been made yet. Infant will remain inpatient until tomorrow so that assessments and plans can completed.  -Celenia can room in with infant after discharge  -She and her partner remain appropriately concerned about if  she will be able to take her baby home with her. She remains attentive to infant cues/needs and initiates care.  -Pending ACDSS recommendations   7. Thyroid cancer  -Dx with thyroid cancer in pregnancy  -Has appt scheduled for MRI mapping postpartum with plans for surgery in August  -Followed by [facility]   Disposition: Discharge planned for today    LOS: 2 days   Teodora Fell, CNM 08/09/2023, 10:31 AM   ----- Fraser Jackson  Certified Nurse Midwife [facility]

## 2023-08-11 LAB — SURGICAL PATHOLOGY

## 2023-11-16 NOTE — Procedures (Signed)
 [facility]                     OPERATIVE NOTE   SURGERY DATE: 11/16/2023  PRE-OP DIAGNOSIS: Thyroid cancer (CMS/HHS-HCC) [C73]    POST-OP DIAGNOSIS: Post-Op Diagnosis Codes:    * Thyroid cancer (CMS/HHS-HCC) [C73]  PROCEDURES: (ES) RIGHT  TOTAL THYROID LOBECTOMY, UNILATERAL; WITH OR WITHOUT ISTHMUSECTOMY: 39779 (CPT)  recurrent laryngeal nerve monitor UNILATERAL: 04131 (CPT)  BIOPSY OR EXCISION OF LYMPH NODE(S); OPEN, SUPERFICIAL CERVICAL / SUPRACLAVICULAR: 38500 (CPT)    SURGEON: Surgeons and Role:    * Monreal, Lyle Crate, MD - Primary    * Eze, Curtistine Bridge, MD - Resident - Assisting  STAFF: Circulator: Delos Alfonso HERO, RN; Mordechai Shuck, RN Scrub Person: Duey Mar, RN; Jacquline Heather BROCKS, RN  ANESTHESIA: General   INDICATION: 28 y.o. female patient with a history notable for recent pregnancy and delivery who presents in consultation for management of a 3.3 cm right thyroid nodule, Bethesda VI, concerning for papillary thyroid cancer. Lymph node mapping ultrasound did not report concerning lymphadenopathy.   OPERATIVE FINDINGS: No clear evidence of extrathyroidal extension of primary thyroid neoplastic process. A pyramidal lobe was excised. Mildly enlarged lymph nodes in right central neck   Thyroid specimen:   Parathyroid:  Both right parathyroids identified  Nerves: right RLN and vagus with intact signal throughout and at the end of the case. The right RLN was adhered to a right central neck lymph node without gross extranodal extension. This was carefully dissected free and excised   Lymph nodes: mildly enlarged lymphadenopathy in the right central neck. Therefore a right central neck/level 6 lymph node dissection was performed  OPERATIVE REPORT:  The patient was seen in the preop area. The risks, benefits, complications, treatment options, and expected outcomes were discussed with the patient. The possibilities of bleeding, infection,recurrent  laryngeal nerve damage, hypocalcemia, the need for additional procedures or reoperation (completion thyroidectomy), pain, scar, reaction to medication, and potential need for thyroid hormone supplementation discussed with the patient. The patient concurred with the proposed plan, giving informed consent. The site of surgery properly noted/marked. The patient was taken to operating room, identified as Kelsey Serrano and the procedure verified. A Time Out was held and the above information confirmed.  The patient was placed supine after induction of a general anesthetic. An orogastric tube was place. All the bony protuberances were padded. Venodyne boots were placed on the legs. Intravenous antibiotics were not administered as they were not indicated. The neck was extended and prepped and draped in standard fashion. The NIMS laryngeal monitoring system and nerve stimulator was positioned and checked to be properly functioning by me. A 5 cm transverse cervical incision was made above the sternal notch within a natural skin fold. Subplatysmal flaps were created superiorly to the thyroid cartilage and inferiorly to the sternum and clavicle. The strap muscles were identified and divided at the midline.  Blunt and electrocautery dissection was used to mobilize the right thyroid lobe in a medial direction. Once the carotid pulsations were seen, I tested the ipsilateral vagus to confirm function and nerve monitoring circuit integrity. Dissection was carried out at the superior pole and the superior pole vessels were tied off with silks and transected.  The superior parathyroid gland was seen in situ and left on a vascularized pedicle. The inferior pole was then mobilized and its polar vessels were tied off with silk and divided with ligasure. The inferior parathyroid gland was seen  in the thyro- thymic tract. The inferior thyroid artery was then identified. The recurrent laryngeal nerve was seen in the classic  position traversing the inferior thyroid artery in TE groove. It was traced distally and proximally under the ligament of Berry, and a lymph node was noted to be adhered to the RLN on this side. There was no gross extranodal extension; it was carefully dissected free and excised. The nerve was gently dissected and mobilized. The thyroid lobe was carefully dissected at the ligament of Court, which was ligated and the RLN preserved intact. The thyroid was taken off the lateral and anterior aspect of the trachea, across the isthmus.  The isthmus was transected with a ligasure device. The right RLN and vagus were once again tested with the cranial nerve monitor and was found to be functional. The thyroid gland was marked for orientation and sent to pathology for permanent section.  Level 6 lymph node dissection: A right level 6 lymph node dissection was then performed given the clinical lymphadenopathy noted. The lymphatic tissue between the carotid sheath and trachea from the level of the hyoid bone to the brachiocephalic vessels was excised. The recurrent laryngeal nerve was identified and carefully preserved during the dissection. The RLN and vagus were again tested and both found to be fully functional.  The neck was made meticulously hemostatic under multiple Valsalva maneuvers and Fibrillar was placed in the paratracheal area.  The incision was then closed by approximating the straps with a 3.0 vicryl and silk suture, 3.0 for platysma, 4.0 vicryl for inverted dermal sutures and subcuticular suture for skin closure. Steristrips and a sterile dressing was then applied.  Instrument, sponge, and needle counts were correct prior to closure and at the conclusion of the case.  Patient was extubated and taken to the recovery room in good condition.   ESTIMATED BLOOD LOSS: minimal  TOTAL IV FLUIDS: per anesthesia  IMPLANTS: none  SPECIMENS:  ID Type Source Tests Collected by Time Destination  1 : Right  thyroid lobe and isthmus; stitch marks superior pole Tissue-Pathology Thyroid, Right Lobe PATHOLOGY - GENERAL / OTHER Monreal, Lyle Crate, MD 11/16/2023 1340   2 : Right level 6 lymph node compartment Tissue-Pathology Lymph Node PATHOLOGY - GENERAL / OTHER Quintin Lyle Crate, MD 11/16/2023 1339      COMPLICATIONS: none  DISPOSITION: PACU - hemodynamically stable.  ATTESTATION:  FR- Surgery - (Entire) - I was present throughout the entire procedure.    Collaborative Endocrine Surgery Quality Improvement Program (CESQIP) Abstraction Procedure Category: Thyroidectomy  Gender: female Patient Age: 28 y.o. Body mass index is 32.66 kg/m. Race: White  Prior neck irradiation: NO Prior anterior neck surgery: NO Preoperative laryngoscopy: NO  Primary diagnosis:Nodule/Neoplasm  FNA: YES FNA classification of dominant nodule:Bethesda VI  Substernal: no Symptoms of Compression: no  Grade of assistant: Resident   Thyroid surgery: Primary  Surgery done: Lobectomy Approach: TRADITIONAL  Parathyroid autotransplant: NO  CND: YES, PROPHYLACTIC, UNILATERAL, RIGHT Lateral ND: NO  Transection of RLN: NO  IOMN: Yes

## 2023-11-24 ENCOUNTER — Emergency Department
Admission: EM | Admit: 2023-11-24 | Discharge: 2023-11-24 | Disposition: A | Discharge status: Home / Self Care | Attending: Emergency Medicine | Admitting: Emergency Medicine

## 2023-11-24 ENCOUNTER — Other Ambulatory Visit: Payer: Self-pay

## 2023-11-24 ENCOUNTER — Emergency Department

## 2023-11-24 DIAGNOSIS — J45909 Unspecified asthma, uncomplicated: Secondary | ICD-10-CM | POA: Insufficient documentation

## 2023-11-24 DIAGNOSIS — Z85850 Personal history of malignant neoplasm of thyroid: Secondary | ICD-10-CM | POA: Insufficient documentation

## 2023-11-24 DIAGNOSIS — R221 Localized swelling, mass and lump, neck: Secondary | ICD-10-CM | POA: Diagnosis not present

## 2023-11-24 DIAGNOSIS — M542 Cervicalgia: Secondary | ICD-10-CM | POA: Diagnosis present

## 2023-11-24 DIAGNOSIS — G8918 Other acute postprocedural pain: Secondary | ICD-10-CM | POA: Diagnosis not present

## 2023-11-24 LAB — CBC WITH DIFFERENTIAL/PLATELET
Abs Immature Granulocytes: 0.03 K/uL (ref 0.00–0.07)
Basophils Absolute: 0 K/uL (ref 0.0–0.1)
Basophils Relative: 0 %
Eosinophils Absolute: 0.2 K/uL (ref 0.0–0.5)
Eosinophils Relative: 2 %
HCT: 45.2 % (ref 36.0–46.0)
Hemoglobin: 15.2 g/dL — ABNORMAL HIGH (ref 12.0–15.0)
Immature Granulocytes: 0 %
Lymphocytes Relative: 47 %
Lymphs Abs: 4.7 K/uL — ABNORMAL HIGH (ref 0.7–4.0)
MCH: 29.3 pg (ref 26.0–34.0)
MCHC: 33.6 g/dL (ref 30.0–36.0)
MCV: 87.1 fL (ref 80.0–100.0)
Monocytes Absolute: 0.6 K/uL (ref 0.1–1.0)
Monocytes Relative: 6 %
Neutro Abs: 4.6 K/uL (ref 1.7–7.7)
Neutrophils Relative %: 45 %
Platelets: 301 K/uL (ref 150–400)
RBC: 5.19 MIL/uL — ABNORMAL HIGH (ref 3.87–5.11)
RDW: 12.3 % (ref 11.5–15.5)
Smear Review: NORMAL
WBC: 10.1 K/uL (ref 4.0–10.5)
nRBC: 0 % (ref 0.0–0.2)

## 2023-11-24 LAB — BASIC METABOLIC PANEL WITH GFR
Anion gap: 8 (ref 5–15)
BUN: 19 mg/dL (ref 6–20)
CO2: 28 mmol/L (ref 22–32)
Calcium: 9 mg/dL (ref 8.9–10.3)
Chloride: 101 mmol/L (ref 98–111)
Creatinine, Ser: 1.25 mg/dL — ABNORMAL HIGH (ref 0.44–1.00)
GFR, Estimated: >60 mL/min (ref >60)
Glucose, Bld: 87 mg/dL (ref 70–99)
Potassium: 4.1 mmol/L (ref 3.5–5.1)
Sodium: 137 mmol/L (ref 135–145)

## 2023-11-24 NOTE — Discharge Instructions (Signed)
 Please follow-up with your endocrinologist/oncologist from [facility].  Inform them you are seen in the emergency department today and that you had a CT scan performed.  Your CT scan is reassuring.  Return to the emergency department for any fever, any trouble breathing/swallowing or any other symptom personally concerning to yourself.

## 2023-11-24 NOTE — ED Triage Notes (Signed)
 Pt reports she had thyroid surgery on 9/15, pt reports for the past 2 days has had left side neck swelling. Pt reports she was seen at [facility] and they did an XR and she was told there is something calcified in her neck and to come here for a CT scan.

## 2023-11-24 NOTE — ED Provider Notes (Signed)
 [facility] Provider Note    Event Date/Time   First MD Initiated Contact with Patient 11/24/23 2118     (approximate)  History   Chief Complaint: Post-op Problem  HPI  Kelsey Serrano is a 28 y.o. female with a past medical history of asthma, thyroid cancer status post recent partial thyroidectomy presents to the emergency department for left neck swelling.  According to the patient she had a partial thyroidectomy 9/15 in which the right lobe of the thyroid was removed.  She had a neck dissection including 2 lymph nodes removed.  Patient states the first few days she felt as if the swelling was coming down however over the last day or 2 she has felt some increased swelling in her left neck.  Patient went to urgent care today where they did an x-ray and they could see something calcified in the neck but could not tell where and recommended she go to the ED for CT scan.  Here patient is awake alert talking.  Speaking in full sentences without difficulty.  No trouble breathing.  Physical Exam   Triage Vital Signs: ED Triage Vitals  Encounter Vitals Group     BP 11/24/23 1941 (!) 156/105     Girls Systolic BP Percentile --      Girls Diastolic BP Percentile --      Boys Systolic BP Percentile --      Boys Diastolic BP Percentile --      Pulse Rate 11/24/23 1941 76     Resp 11/24/23 1941 19     Temp 11/24/23 1941 98.7 F (37.1 C)     Temp Source 11/24/23 1941 Oral     SpO2 11/24/23 1941 96 %     Weight 11/24/23 1940 185 lb (83.9 kg)     Height 11/24/23 1940 5' 2 (1.575 m)     Head Circumference --      Peak Flow --      Pain Score 11/24/23 1940 6     Pain Loc --      Pain Education --      Exclude from Growth Chart --     Most recent vital signs: Vitals:   11/24/23 1941  BP: (!) 156/105  Pulse: 76  Resp: 19  Temp: 98.7 F (37.1 C)  SpO2: 96%    General: Awake, no distress.  CV:  Good peripheral perfusion.  Regular rate and rhythm   Resp:  Normal effort.  Equal breath sounds bilaterally.  Abd:  No distention.   Other:  Are all well-appearing/normal-appearing neck.  No objective swelling on my evaluation.  Patient does have some tenderness over the left anterior cervical lymph node chain but no significant swelling to this area identified.  Patient still has Steri-Strips covering her lower neck incision but no surrounding erythema or signs of infection.  No intra oral swelling, widely patent oropharynx.   ED Results / Procedures / Treatments   RADIOLOGY  I have reviewed interpret the CT images.  I do not appreciate any significant swelling or deviation of the airway. Radiology essentially read the CT scan as postoperative findings.   MEDICATIONS ORDERED IN ED: Medications - No data to display   IMPRESSION / MDM / ASSESSMENT AND PLAN / ED COURSE  I reviewed the triage vital signs and the nursing notes.  Patient's presentation is most consistent with acute presentation with potential threat to life or bodily function.  Patient presents emergency department for left neck  swelling approximately 8 days after a right sided partial thyroidectomy.  Given her recent thyroidectomy we will obtain a CT scan without contrast.  We will check labs and continue to closely monitor.  Patient CBC shows no concerning findings.  Chemistry overall reassuring.  Slightly hypertensive otherwise reassuring vital signs without fever.  CT scan pending.  CT essentially read as postoperative findings.  No obvious signs of infection, no fever, no leukocytosis.  We will have the patient follow-up with her endocrinologist/oncologist at [facility].  Patient agreeable to plan.  FINAL CLINICAL IMPRESSION(S) / ED DIAGNOSES   Postoperative swelling    Note:  This document was prepared using Dragon voice recognition software and may include unintentional dictation errors.   Dorothyann Drivers, MD 11/24/23 [phone]
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
## 2024-01-07 ENCOUNTER — Emergency Department
Admission: EM | Admit: 2024-01-07 | Discharge: 2024-01-07 | Discharge status: Against Medical Advice | Attending: Emergency Medicine | Admitting: Emergency Medicine

## 2024-01-07 ENCOUNTER — Other Ambulatory Visit: Payer: Self-pay

## 2024-01-07 DIAGNOSIS — Z5321 Procedure and treatment not carried out due to patient leaving prior to being seen by health care provider: Secondary | ICD-10-CM | POA: Diagnosis not present

## 2024-01-07 DIAGNOSIS — M542 Cervicalgia: Secondary | ICD-10-CM | POA: Diagnosis present

## 2024-01-07 DIAGNOSIS — R599 Enlarged lymph nodes, unspecified: Secondary | ICD-10-CM | POA: Diagnosis not present

## 2024-01-07 LAB — COMPREHENSIVE METABOLIC PANEL WITH GFR
ALT: 14 U/L (ref 0–44)
AST: 17 U/L (ref 15–41)
Albumin: 4 g/dL (ref 3.5–5.0)
Alkaline Phosphatase: 86 U/L (ref 38–126)
Anion gap: 10 (ref 5–15)
BUN: 18 mg/dL (ref 6–20)
CO2: 26 mmol/L (ref 22–32)
Calcium: 9.3 mg/dL (ref 8.9–10.3)
Chloride: 101 mmol/L (ref 98–111)
Creatinine, Ser: 0.84 mg/dL (ref 0.44–1.00)
GFR, Estimated: >60 mL/min (ref >60)
Glucose, Bld: 101 mg/dL — ABNORMAL HIGH (ref 70–99)
Potassium: 4 mmol/L (ref 3.5–5.1)
Sodium: 137 mmol/L (ref 135–145)
Total Bilirubin: 0.5 mg/dL (ref 0.0–1.2)
Total Protein: 7.7 g/dL (ref 6.5–8.1)

## 2024-01-07 LAB — CBC WITH DIFFERENTIAL/PLATELET
Abs Immature Granulocytes: 0.02 K/uL (ref 0.00–0.07)
Basophils Absolute: 0 K/uL (ref 0.0–0.1)
Basophils Relative: 0 %
Eosinophils Absolute: 0.2 K/uL (ref 0.0–0.5)
Eosinophils Relative: 1 %
HCT: 43.1 % (ref 36.0–46.0)
Hemoglobin: 14.7 g/dL (ref 12.0–15.0)
Immature Granulocytes: 0 %
Lymphocytes Relative: 35 %
Lymphs Abs: 3.9 K/uL (ref 0.7–4.0)
MCH: 29.6 pg (ref 26.0–34.0)
MCHC: 34.1 g/dL (ref 30.0–36.0)
MCV: 86.7 fL (ref 80.0–100.0)
Monocytes Absolute: 0.6 K/uL (ref 0.1–1.0)
Monocytes Relative: 6 %
Neutro Abs: 6.5 K/uL (ref 1.7–7.7)
Neutrophils Relative %: 58 %
Platelets: 246 K/uL (ref 150–400)
RBC: 4.97 MIL/uL (ref 3.87–5.11)
RDW: 11.8 % (ref 11.5–15.5)
WBC: 11.2 K/uL — ABNORMAL HIGH (ref 4.0–10.5)
nRBC: 0 % (ref 0.0–0.2)

## 2024-01-07 NOTE — ED Notes (Signed)
 Patient approached [facility] desk at this time inquiring about wait time. Explained that we could not give out wait times at this time. Stated she was just going to leave and come back after work. Patient exited ER without distress.

## 2024-01-07 NOTE — ED Notes (Signed)
 Discussed patient case over the phone with Levander, MD.

## 2024-01-07 NOTE — ED Triage Notes (Signed)
 Patient ambulatory to triage with complaints of worsening swollen lymph nodes and increased pain going down her neck. Has had multiple recent visits and PCP recommended she come to the ER for an US  to r/o cyst vs. Abscess.
